# Patient Record
Sex: Male | Born: 1965
Health system: Southern US, Community
[De-identification: ages and names within clinical notes are randomized; demographics above are authoritative.]

## PROBLEM LIST (undated history)

## (undated) DIAGNOSIS — K589 Irritable bowel syndrome without diarrhea: Secondary | ICD-10-CM

## (undated) DIAGNOSIS — C801 Malignant (primary) neoplasm, unspecified: Secondary | ICD-10-CM

## (undated) DIAGNOSIS — K219 Gastro-esophageal reflux disease without esophagitis: Secondary | ICD-10-CM

## (undated) DIAGNOSIS — Z87442 Personal history of urinary calculi: Secondary | ICD-10-CM

## (undated) DIAGNOSIS — T7840XA Allergy, unspecified, initial encounter: Secondary | ICD-10-CM

## (undated) HISTORY — DX: Malignant (primary) neoplasm, unspecified: C80.1

## (undated) HISTORY — PX: COLONOSCOPY: SHX174

## (undated) HISTORY — PX: OTHER SURGICAL HISTORY: SHX169

## (undated) HISTORY — PX: EXCISIONAL HEMORRHOIDECTOMY: SHX1541

## (undated) HISTORY — DX: Allergy, unspecified, initial encounter: T78.40XA

---

## 2004-09-27 ENCOUNTER — Ambulatory Visit: Payer: Self-pay | Admitting: Family Medicine

## 2005-07-04 ENCOUNTER — Ambulatory Visit: Payer: Self-pay | Admitting: Family Medicine

## 2006-06-23 ENCOUNTER — Ambulatory Visit: Payer: Self-pay | Admitting: Family Medicine

## 2007-01-04 ENCOUNTER — Ambulatory Visit: Payer: Self-pay | Admitting: Family Medicine

## 2007-01-04 DIAGNOSIS — M545 Low back pain, unspecified: Secondary | ICD-10-CM | POA: Insufficient documentation

## 2008-08-14 ENCOUNTER — Ambulatory Visit: Payer: Self-pay | Admitting: Internal Medicine

## 2008-08-24 ENCOUNTER — Ambulatory Visit: Payer: Self-pay | Admitting: Internal Medicine

## 2008-08-24 LAB — CONVERTED CEMR LAB
ALT: 24 units/L (ref 0–53)
AST: 20 units/L (ref 0–37)
Albumin: 4 g/dL (ref 3.5–5.2)
Alkaline Phosphatase: 60 units/L (ref 39–117)
Basophils Relative: 0.3 % (ref 0.0–3.0)
Calcium: 9.1 mg/dL (ref 8.4–10.5)
Chloride: 106 meq/L (ref 96–112)
GFR calc non Af Amer: 77.9 mL/min (ref >60)
HCT: 45.9 % (ref 39.0–52.0)
Monocytes Absolute: 1.2 10*3/uL — ABNORMAL HIGH (ref 0.1–1.0)
Monocytes Relative: 14.7 % — ABNORMAL HIGH (ref 3.0–12.0)
Neutro Abs: 4.3 10*3/uL (ref 1.4–7.7)
Neutrophils Relative %: 50.2 % (ref 43.0–77.0)
Platelets: 276 10*3/uL (ref 150.0–400.0)
Potassium: 4.3 meq/L (ref 3.5–5.1)
RBC: 5.14 M/uL (ref 4.22–5.81)
Sodium: 140 meq/L (ref 135–145)
WBC: 8.4 10*3/uL (ref 4.5–10.5)

## 2008-09-28 ENCOUNTER — Ambulatory Visit: Payer: Self-pay | Admitting: Internal Medicine

## 2009-11-14 ENCOUNTER — Ambulatory Visit (HOSPITAL_COMMUNITY): Admission: RE | Admit: 2009-11-14 | Discharge: 2009-11-14 | Payer: Self-pay | Admitting: Specialist

## 2009-12-04 ENCOUNTER — Encounter (INDEPENDENT_AMBULATORY_CARE_PROVIDER_SITE_OTHER): Payer: Self-pay | Admitting: *Deleted

## 2010-05-28 NOTE — Letter (Signed)
Summary: Nadara Eaton letter  [facility]  [address] [place], [place] [address]   Phone: [phone]  Fax: [phone]       12/04/2009 MRN: [id number]  LEBERT LOVERN [address] [place], [place]  [address]  Dear Mr. Ragon,  [facility] [place] and [place] [facility] announce the retirement of Arta Silence, M.D., from full-time practice at the [facility] office effective October 25, 2009 and his plans of returning part-time.  It is important to Dr. Hetty Ely and to our practice that you understand that [facility] has seven physicians in our office for your health care needs.  We will continue to offer the same exceptional care that you have today.    Dr. Hetty Ely has spoken to many of you about his plans for retirement and returning part-time in the fall.   We will continue to work with you through the transition to schedule appointments for you in the office and meet the high standards that [facility] is committed to.   Again, it is with great pleasure that we share the news that Dr. Hetty Ely will return to [facility] in October of 2011 with a reduced schedule.    If you have any questions, or would like to request an appointment with one of our physicians, please call us at [phone] and press the option for Scheduling an appointment.  We take pleasure in providing you with excellent patient care and look forward to seeing you at your next office visit.  Our [facility] Physicians are:  Tillman Abide, M.D. Laurita Quint, M.D. Roxy Manns, M.D. Kerby Nora, M.D. Hannah Beat, M.D. Ruthe Mannan, M.D. We proudly welcomed Raechel Ache, M.D. and Eustaquio Boyden, M.D. to the practice in July/August 2011.  Sincerely,  [facility]

## 2010-07-16 ENCOUNTER — Ambulatory Visit (INDEPENDENT_AMBULATORY_CARE_PROVIDER_SITE_OTHER): Payer: 59 | Admitting: Internal Medicine

## 2010-07-16 ENCOUNTER — Encounter: Payer: Self-pay | Admitting: Internal Medicine

## 2010-07-16 DIAGNOSIS — S339XXA Sprain of unspecified parts of lumbar spine and pelvis, initial encounter: Secondary | ICD-10-CM | POA: Insufficient documentation

## 2010-07-16 DIAGNOSIS — M549 Dorsalgia, unspecified: Secondary | ICD-10-CM

## 2010-07-16 DIAGNOSIS — S335XXA Sprain of ligaments of lumbar spine, initial encounter: Secondary | ICD-10-CM

## 2010-07-25 NOTE — Letter (Signed)
Summary: history form   history form   Imported By: Eugenio Hoes 07/16/2010 15:09:49  _____________________________________________________________________  External Attachment:    Type:   Image     Comment:   External Document

## 2010-07-25 NOTE — Assessment & Plan Note (Signed)
Summary: LOWER BACK PAIN    Prior Medication List:  DICYCLOMINE HCL 20 MG  TABS (DICYCLOMINE HCL) take one tablet by mouth three times a day before each meal PRILOSEC OTC 20 MG  TBEC (OMEPRAZOLE MAGNESIUM) 1 tablet by mouth once daily in the morning   Current Allergies: No known allergies History of Present Illness Chief Complaint: low back pain x 4 days History of Present Illness: Low back pain episodes several times since injured in [place] yrs ago. Usually gets well in a few days and no studies ever done. Used to do alot of stretching but became less diligent. On 3/16 was transferring cases of sodas and felt twinge in low back which became slowly worse. Pain was severe yesterday, medium today and is dull, bilateral lumbar and sij area. No radiation, weakness or numbness in legs. 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 Has been on ibup  600 four times daily.  REVIEW OF SYSTEMS Constitutional Symptoms      Denies fever, chills, night sweats, weight loss, weight gain, and fatigue.  Eyes       Denies change in vision, eye pain, eye discharge, glasses, contact lenses, and eye surgery. Ear/Nose/Throat/Mouth       Denies hearing loss/aids, change in hearing, ear pain, ear discharge, dizziness, frequent runny nose, frequent nose bleeds, sinus problems, sore throat, hoarseness, and tooth pain or bleeding.  Respiratory       Denies dry cough, productive cough, wheezing, shortness of breath, asthma, bronchitis, and emphysema/COPD.  Cardiovascular       Denies murmurs, chest pain, and tires easily with exhertion.    Gastrointestinal       Denies stomach pain, nausea/vomiting, diarrhea, constipation, blood in bowel movements, and indigestion. Genitourniary       Denies painful urination, kidney stones, and loss of urinary control. Neurological       Denies paralysis, seizures, and fainting/blackouts. Musculoskeletal       Complains of decreased range of motion.      Denies muscle pain,  joint pain, joint stiffness, redness, swelling, muscle weakness, and gout.  Skin       Denies bruising, unusual mles/lumps or sores, and hair/skin or nail changes.  Psych       Denies mood changes, temper/anger issues, anxiety/stress, speech problems, depression, and sleep problems.  Past History:  Past Medical History: Last updated: 08/14/2008 Diverticulosis  Past Surgical History: Last updated: 08/14/2008 Hemorrhoidectomy  Family History: Last updated: 08/14/2008 No FH of Colon Cancer:  Social History: Last updated: 07/16/2010 Occupation: Patent examiner, [employer]; Archivist, [employer] Patient has never smoked.  Alcohol Use - yes Daily Caffeine Use Illicit Drug Use - no Married, no children  Social History: Occupation: Patent examiner, [employer]; Archivist, [employer] Patient has never smoked.  Alcohol Use - yes Daily Caffeine Use Illicit Drug Use - no Married, no children Physical Exam General appearance: well developed, well nourished, no acute distress Head: normocephalic, atraumatic Neck: neck supple,  trachea midline, no masses Extremities: normal extremities Neurological: grossly intact and non-focal. Back: tender musculature right lower back, straight leg raises negative bilaterally, deep tendon reflexes 2+ at achilles and patella Skin: no obvious rashes MSE: oriented to time, place, and person Assessment New Problems: SPRAIN AND STRAIN OF LUMBOSACRAL (ICD-846.0)   Plan New Medications/Changes: METHOCARBAMOL 750 MG TABS (METHOCARBAMOL) 1-2 by mouth four times daily as needed back spasm  #40 x 1, 07/16/2010, J. Juline Patch MD  The patient and/or caregiver has been counseled thoroughly with regard to medications prescribed including dosage, schedule, interactions, rationale for use, and possible side effects and they verbalize understanding.  Diagnoses and expected course of  recovery discussed and will return if not improved as expected or if the condition worsens. Patient and/or caregiver verbalized understanding.  Prescriptions: METHOCARBAMOL 750 MG TABS (METHOCARBAMOL) 1-2 by mouth four times daily as needed back spasm  #40 x 1   Entered and Authorized by:   J. Juline Patch MD   Signed by:   Shela Commons. Juline Patch MD on 07/16/2010   Method used:   Electronically to        [facility]  [address] (retail)       [address]       [place], [place]  [address]       Ph: [phone] or [phone]       Fax: [phone]   RxID:   [id number]   Patient Instructions: 1)  gentle stretches as per previous. 2)  alternating ice and heat to lumbar. 3)  proper technique lifting and wt transfer. 4)  continue ibuprofen 4 times daily with food. 5)  Take 650-1000mg  of Tylenol every 4-6 hours as needed for relief of pain or comfort of fever AVOID taking more than 4000mg   in a 24 hour period (can cause liver damage in higher doses). 6)  Recommended remaining out of work for 5 days

## 2013-03-09 ENCOUNTER — Other Ambulatory Visit: Payer: Self-pay | Admitting: Family Medicine

## 2013-03-09 ENCOUNTER — Ambulatory Visit
Admission: RE | Admit: 2013-03-09 | Discharge: 2013-03-09 | Disposition: A | Discharge status: Home / Self Care | Payer: 59 | Source: Ambulatory Visit | Attending: Family Medicine | Admitting: Family Medicine

## 2013-03-09 DIAGNOSIS — M545 Low back pain, unspecified: Secondary | ICD-10-CM

## 2014-05-26 ENCOUNTER — Ambulatory Visit (INDEPENDENT_AMBULATORY_CARE_PROVIDER_SITE_OTHER): Payer: Self-pay | Admitting: Surgery

## 2014-05-26 NOTE — H&P (Signed)
History of Present Illness Martin Mcclure. Martin Waltman MD; 05/26/2014 3:56 PM) Patient words: lipomas left arm, left thigh, and back.  The patient is a 49 year old male who presents with a complaint of Mass. Referred by Lennie Odor PA-C for evaluation of multiple lipomas This is a healthy 49 yo male who presents with four separate slowly enlarging masses (L thigh, L forearm, R abdominal wall, R side of back). Some of these have begun to become tender to pressure. None of these have become infected. He presents now for evaluation for excision. Other Problems Marjean Donna, CMA; 05/26/2014 10:04 AM) Back Pain Hemorrhoids Melanoma  Past Surgical History Marjean Donna, CMA; 05/26/2014 10:04 AM) Hemorrhoidectomy  Diagnostic Studies History Marjean Donna, CMA; 05/26/2014 10:04 AM) Colonoscopy 1-5 years ago  Allergies Davy Pique Bynum, CMA; 05/26/2014 10:05 AM) No Known Drug Allergies 05/26/2014  Medication History (Sonya Bynum, CMA; 05/26/2014 10:05 AM) No Current Medications  Social History Marjean Donna, CMA; 05/26/2014 10:04 AM) Alcohol use Occasional alcohol use. Caffeine use Coffee. No drug use Tobacco use Never smoker.  Family History Marjean Donna, [place]; 05/26/2014 10:04 AM) Alcohol Abuse Father. Seizure disorder Sister.     Review of Systems Davy Pique Bynum CMA; 05/26/2014 10:04 AM) General Not Present- Appetite Loss, Chills, Fatigue, Fever, Night Sweats, Weight Gain and Weight Loss. Skin Not Present- Change in Wart/Mole, Dryness, Hives, Jaundice, New Lesions, Non-Healing Wounds, Rash and Ulcer. HEENT Not Present- Earache, Hearing Loss, Hoarseness, Nose Bleed, Oral Ulcers, Ringing in the Ears, Seasonal Allergies, Sinus Pain, Sore Throat, Visual Disturbances, Wears glasses/contact lenses and Yellow Eyes. Respiratory Not Present- Bloody sputum, Chronic Cough, Difficulty Breathing, Snoring and Wheezing. Breast Not Present- Breast Mass, Breast Pain, Nipple Discharge and Skin  Changes. Cardiovascular Not Present- Chest Pain, Difficulty Breathing Lying Down, Leg Cramps, Palpitations, Rapid Heart Rate, Shortness of Breath and Swelling of Extremities. Gastrointestinal Not Present- Abdominal Pain, Bloating, Bloody Stool, Change in Bowel Habits, Chronic diarrhea, Constipation, Difficulty Swallowing, Excessive gas, Gets full quickly at meals, Hemorrhoids, Indigestion, Nausea, Rectal Pain and Vomiting. Male Genitourinary Not Present- Blood in Urine, Change in Urinary Stream, Frequency, Impotence, Nocturia, Painful Urination, Urgency and Urine Leakage. Musculoskeletal Not Present- Back Pain, Joint Pain, Joint Stiffness, Muscle Pain, Muscle Weakness and Swelling of Extremities. Neurological Not Present- Decreased Memory, Fainting, Headaches, Numbness, Seizures, Tingling, Tremor, Trouble walking and Weakness. Psychiatric Not Present- Anxiety, Bipolar, Change in Sleep Pattern, Depression, Fearful and Frequent crying. Endocrine Not Present- Cold Intolerance, Excessive Hunger, Hair Changes, Heat Intolerance, Hot flashes and New Diabetes. Hematology Not Present- Easy Bruising, Excessive bleeding, Gland problems, HIV and Persistent Infections.  Vitals (Sonya Bynum CMA; 05/26/2014 10:05 AM) 05/26/2014 10:05 AM Weight: 228 lb Height: 70in Body Surface Area: 2.26 m Body Mass Index: 32.71 kg/m Temp.: 61F(Temporal)  Pulse: 72 (Regular)  BP: 128/72 (Sitting, Left Arm, Standard)     Physical Exam Rodman Key K. Baya Lentz MD; 05/26/2014 3:59 PM)  The physical exam findings are as follows: Note:WDWN in NAD L volar forearm - 2 cm subcutaneous mass, soft, well-demarcated L anterior thigh - 4 cm palpable subcutaneous mass, soft, well-demarcated R anterior abdominal wall below costal margin - 3 cm palpable subcutaneous mass, soft, well-demarcated R side of back medial to scapula - 3 cm palpable subcutaneous mass    Assessment & Plan Rodman Key K. Deonna Krummel MD; 05/26/2014 10:32  AM)  LIPOMA OF LEFT UPPER EXTREMITY (214.8  D17.22)  LIPOMA OF BACK (214.8  D17.1)  LIPOMA OF ABDOMINAL WALL (214.1  D17.1)  LIPOMA OF LEFT LOWER EXTREMITY (214.1  D17.24)  Current Plans Schedule for Surgery - Excision of subcutaneous lipomas - left forearm, left anterior thigh, right anterior abdominal wall, right back. The surgical procedure has been discussed with the patient. Potential risks, benefits, alternative treatments, and expected outcomes have been explained. All of the patient's questions at this time have been answered. The likelihood of reaching the patient's treatment goal is good. The patient understand the proposed surgical procedure and wishes to proceed.   Martin Mcclure. Georgette [place], MD, [facility] Surgery  General/ Trauma Surgery  05/26/2014 4:01 PM

## 2014-11-27 ENCOUNTER — Encounter: Payer: Self-pay | Admitting: Internal Medicine

## 2016-04-30 DIAGNOSIS — J069 Acute upper respiratory infection, unspecified: Secondary | ICD-10-CM | POA: Diagnosis not present

## 2016-06-24 DIAGNOSIS — Z125 Encounter for screening for malignant neoplasm of prostate: Secondary | ICD-10-CM | POA: Diagnosis not present

## 2016-06-24 DIAGNOSIS — Z0000 Encounter for general adult medical examination without abnormal findings: Secondary | ICD-10-CM | POA: Diagnosis not present

## 2016-07-18 ENCOUNTER — Ambulatory Visit: Payer: Self-pay | Admitting: Surgery

## 2016-07-18 DIAGNOSIS — D1724 Benign lipomatous neoplasm of skin and subcutaneous tissue of left leg: Secondary | ICD-10-CM | POA: Diagnosis not present

## 2016-07-18 DIAGNOSIS — D1722 Benign lipomatous neoplasm of skin and subcutaneous tissue of left arm: Secondary | ICD-10-CM | POA: Diagnosis not present

## 2016-07-18 DIAGNOSIS — D171 Benign lipomatous neoplasm of skin and subcutaneous tissue of trunk: Secondary | ICD-10-CM | POA: Diagnosis not present

## 2016-07-18 NOTE — H&P (Signed)
  History of Present Illness Martin Mcclure. Antia Rahal MD; 07/18/2016 12:31 PM) The patient is a 51 year old male who presents with a complaint of Mass. The patient is a 51 year old male who presents with a complaint of multiple Masses. Referred by Lennie Odor PA-C for evaluation of multiple lipomas This is a healthy 51 yo male who presents with five separate slowly enlarging masses (L thigh, L forearm, R abdominal wall, L abdominal wall R side of back). Some of these have begun to become tender to pressure. None of these have become infected. He presents now for evaluation for excision. He was evaluated in 2016, but decided not to have surgery at that time. These have enlarged, so he comes in today to discuss excision.   Problem List/Past Medical Rodman Key K. Izzabell Klasen, MD; 07/18/2016 12:32 PM) LIPOMA OF LEFT LOWER EXTREMITY (D17.24) LIPOMA OF LEFT UPPER EXTREMITY (T15.72)  Past Surgical History (Gianny Sabino K. Georgette [place], MD; 07/18/2016 12:32 PM) Hemorrhoidectomy  Diagnostic Studies History Rodman Key K. Starlin Steib, MD; 07/18/2016 12:32 PM) Colonoscopy 1-5 years ago  Allergies Rodman Key K. Huntley Knoop, MD; 07/18/2016 12:32 PM) No Known Drug Allergies 05/26/2014  Medication History Martin Mcclure. Sukhdeep Wieting, MD; 07/18/2016 12:32 PM) Fish Oil (1200MG  Capsule, Oral daily) Active. Medications Reconciled No Current Medications  Social History Martin Mcclure. Vence Lalor, MD; 07/18/2016 12:32 PM) Alcohol use Occasional alcohol use. Caffeine use Coffee. No drug use Tobacco use Never smoker.  Family History Martin Mcclure. Pinkney Venard, MD; 07/18/2016 12:32 PM) Alcohol Abuse Father. Seizure disorder Sister.  Other Problems Martin Mcclure. Kafi Dotter, MD; 07/18/2016 12:32 PM) Back Pain Hemorrhoids Melanoma    Vitals Malachy Moan RMA; 07/18/2016 9:39 AM) 07/18/2016 9:38 AM Weight: 220.2 lb Height: 70in Body Surface Area: 2.17 m Body Mass Index: 31.6 kg/m  Temp.: 98.55F  Pulse: 57 (Regular)  BP: 118/68 (Sitting, Left Arm,  Standard)      Physical Exam Rodman Key K. Manette Doto MD; 07/18/2016 12:32 PM)  The physical exam findings are as follows: Note:WDWN in NAD L volar forearm - 2 cm subcutaneous mass, soft, well-demarcated L anterior thigh - 3 cm palpable subcutaneous mass, soft, well-demarcated R anterior abdominal wall below costal margin - 2.5 cm palpable subcutaneous mass, soft, well-demarcated L anterior abdominal wall below costal margin - 1 cm palpable subcutaneous mass, soft, well-demarcated R side of back medial to scapula - 3 cm palpable subcutaneous mass    Assessment & Plan Rodman Key K. Jowanda Heeg MD; 07/18/2016 10:16 AM)  LIPOMA OF LEFT LOWER EXTREMITY (D17.24)  Current Plans Schedule for Surgery - Excision of subcutaneous lipomas - upper abdominal wall x 2, left forearm, left anterior thigh, upper back. The surgical procedure has been discussed with the patient. Potential risks, benefits, alternative treatments, and expected outcomes have been explained. All of the patient's questions at this time have been answered. The likelihood of reaching the patient's treatment goal is good. The patient understand the proposed surgical procedure and wishes to proceed. LIPOMA OF ABDOMINAL WALL (D17.1)  LIPOMA OF LEFT UPPER EXTREMITY (D17.22)  Martin Mcclure. Georgette [place], MD, [facility] Surgery  General/ Trauma Surgery  07/18/2016 12:33 PM

## 2016-10-09 DIAGNOSIS — D171 Benign lipomatous neoplasm of skin and subcutaneous tissue of trunk: Secondary | ICD-10-CM | POA: Diagnosis not present

## 2016-10-09 DIAGNOSIS — D1724 Benign lipomatous neoplasm of skin and subcutaneous tissue of left leg: Secondary | ICD-10-CM | POA: Diagnosis not present

## 2016-10-09 DIAGNOSIS — D1722 Benign lipomatous neoplasm of skin and subcutaneous tissue of left arm: Secondary | ICD-10-CM | POA: Diagnosis not present

## 2017-02-13 DIAGNOSIS — Z23 Encounter for immunization: Secondary | ICD-10-CM | POA: Diagnosis not present

## 2017-02-14 DIAGNOSIS — N481 Balanitis: Secondary | ICD-10-CM | POA: Diagnosis not present

## 2017-03-11 DIAGNOSIS — R21 Rash and other nonspecific skin eruption: Secondary | ICD-10-CM | POA: Diagnosis not present

## 2017-04-02 DIAGNOSIS — N481 Balanitis: Secondary | ICD-10-CM | POA: Diagnosis not present

## 2017-04-14 DIAGNOSIS — L821 Other seborrheic keratosis: Secondary | ICD-10-CM | POA: Diagnosis not present

## 2017-04-14 DIAGNOSIS — L570 Actinic keratosis: Secondary | ICD-10-CM | POA: Diagnosis not present

## 2017-04-14 DIAGNOSIS — L814 Other melanin hyperpigmentation: Secondary | ICD-10-CM | POA: Diagnosis not present

## 2017-04-14 DIAGNOSIS — D225 Melanocytic nevi of trunk: Secondary | ICD-10-CM | POA: Diagnosis not present

## 2017-04-30 DIAGNOSIS — R21 Rash and other nonspecific skin eruption: Secondary | ICD-10-CM | POA: Diagnosis not present

## 2017-05-04 DIAGNOSIS — L309 Dermatitis, unspecified: Secondary | ICD-10-CM | POA: Diagnosis not present

## 2017-05-07 DIAGNOSIS — H6121 Impacted cerumen, right ear: Secondary | ICD-10-CM | POA: Diagnosis not present

## 2017-05-07 DIAGNOSIS — H6061 Unspecified chronic otitis externa, right ear: Secondary | ICD-10-CM | POA: Diagnosis not present

## 2017-05-07 DIAGNOSIS — H6691 Otitis media, unspecified, right ear: Secondary | ICD-10-CM | POA: Diagnosis not present

## 2017-05-19 DIAGNOSIS — H6121 Impacted cerumen, right ear: Secondary | ICD-10-CM | POA: Diagnosis not present

## 2017-05-19 DIAGNOSIS — H6061 Unspecified chronic otitis externa, right ear: Secondary | ICD-10-CM | POA: Diagnosis not present

## 2017-06-09 DIAGNOSIS — H6062 Unspecified chronic otitis externa, left ear: Secondary | ICD-10-CM | POA: Diagnosis not present

## 2017-06-09 DIAGNOSIS — H6502 Acute serous otitis media, left ear: Secondary | ICD-10-CM | POA: Diagnosis not present

## 2017-06-16 DIAGNOSIS — L309 Dermatitis, unspecified: Secondary | ICD-10-CM | POA: Diagnosis not present

## 2017-06-25 ENCOUNTER — Encounter: Payer: Self-pay | Admitting: Internal Medicine

## 2017-06-25 DIAGNOSIS — Z125 Encounter for screening for malignant neoplasm of prostate: Secondary | ICD-10-CM | POA: Diagnosis not present

## 2017-06-25 DIAGNOSIS — Z0000 Encounter for general adult medical examination without abnormal findings: Secondary | ICD-10-CM | POA: Diagnosis not present

## 2017-06-25 DIAGNOSIS — Z13220 Encounter for screening for lipoid disorders: Secondary | ICD-10-CM | POA: Diagnosis not present

## 2017-09-15 ENCOUNTER — Telehealth (HOSPITAL_BASED_OUTPATIENT_CLINIC_OR_DEPARTMENT_OTHER): Payer: Self-pay | Admitting: *Deleted

## 2017-09-15 ENCOUNTER — Encounter (HOSPITAL_BASED_OUTPATIENT_CLINIC_OR_DEPARTMENT_OTHER): Payer: Self-pay | Admitting: Emergency Medicine

## 2017-09-15 ENCOUNTER — Other Ambulatory Visit: Payer: Self-pay

## 2017-09-15 ENCOUNTER — Emergency Department (HOSPITAL_BASED_OUTPATIENT_CLINIC_OR_DEPARTMENT_OTHER)
Admission: EM | Admit: 2017-09-15 | Discharge: 2017-09-15 | Disposition: A | Discharge status: Home / Self Care | Payer: 59 | Attending: Emergency Medicine | Admitting: Emergency Medicine

## 2017-09-15 DIAGNOSIS — R197 Diarrhea, unspecified: Secondary | ICD-10-CM

## 2017-09-15 HISTORY — DX: Irritable bowel syndrome, unspecified: K58.9

## 2017-09-15 LAB — GASTROINTESTINAL PANEL BY PCR, STOOL (REPLACES STOOL CULTURE)
ASTROVIRUS: NOT DETECTED
Adenovirus F40/41: NOT DETECTED
CAMPYLOBACTER SPECIES: NOT DETECTED
Cryptosporidium: NOT DETECTED
Cyclospora cayetanensis: NOT DETECTED
ENTEROTOXIGENIC E COLI (ETEC): DETECTED — AB
Entamoeba histolytica: NOT DETECTED
Enteroaggregative E coli (EAEC): DETECTED — AB
Enteropathogenic E coli (EPEC): NOT DETECTED
Giardia lamblia: NOT DETECTED
NOROVIRUS GI/GII: NOT DETECTED
PLESIMONAS SHIGELLOIDES: NOT DETECTED
Rotavirus A: NOT DETECTED
SAPOVIRUS (I, II, IV, AND V): NOT DETECTED
SHIGA LIKE TOXIN PRODUCING E COLI (STEC): NOT DETECTED
Salmonella species: NOT DETECTED
Shigella/Enteroinvasive E coli (EIEC): NOT DETECTED
Vibrio cholerae: NOT DETECTED
Vibrio species: NOT DETECTED
Yersinia enterocolitica: NOT DETECTED

## 2017-09-15 LAB — COMPREHENSIVE METABOLIC PANEL
ALT: 22 U/L (ref 17–63)
ANION GAP: 10 (ref 5–15)
AST: 20 U/L (ref 15–41)
Albumin: 4 g/dL (ref 3.5–5.0)
Alkaline Phosphatase: 48 U/L (ref 38–126)
BUN: 12 mg/dL (ref 6–20)
CHLORIDE: 107 mmol/L (ref 101–111)
CO2: 22 mmol/L (ref 22–32)
Calcium: 8.8 mg/dL — ABNORMAL LOW (ref 8.9–10.3)
Creatinine, Ser: 1 mg/dL (ref 0.61–1.24)
GFR calc non Af Amer: >60 mL/min (ref >60)
Glucose, Bld: 99 mg/dL (ref 65–99)
POTASSIUM: 3.8 mmol/L (ref 3.5–5.1)
Sodium: 139 mmol/L (ref 135–145)
Total Bilirubin: 0.6 mg/dL (ref 0.3–1.2)
Total Protein: 7 g/dL (ref 6.5–8.1)

## 2017-09-15 LAB — C DIFFICILE QUICK SCREEN W PCR REFLEX
C DIFFICILE (CDIFF) INTERP: NOT DETECTED
C DIFFICLE (CDIFF) ANTIGEN: NEGATIVE
C Diff toxin: NEGATIVE

## 2017-09-15 MED ORDER — CIPROFLOXACIN HCL 500 MG PO TABS: 500.0000 mg | ORAL_TABLET | Freq: Two times a day (BID) | ORAL | 0 refills | Status: DC

## 2017-09-15 MED FILL — CIPROFLOXACIN HCL 500 MG TA: 500 | 5 days supply | Qty: 10 | Fill #0

## 2017-09-15 NOTE — Discharge Instructions (Addendum)
Take Imodium as directed for diarrhea. Avoid milk or foods containing milk such as cheese or ice cream while having diarrhea.Make sure that you drink at least six 8 ounce glasses of water or Gatorade each day in order to stay well-hydrated. See your primary care physician if diarrhea is not slowing or if not feeling better in 2-3 days. Return if you develop lightheadedness, fainting or if your condition worsens for any reason.

## 2017-09-15 NOTE — ED Triage Notes (Signed)
Watery diarrhea since Thursday. Denies vomiting, fever. 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

## 2017-09-15 NOTE — ED Provider Notes (Addendum)
[facility] EMERGENCY DEPARTMENT Provider Note   CSN: [id number] Arrival date & time: 09/15/17  8341     History   Chief Complaint Chief Complaint  Patient presents with  . Diarrhea    HPI Martin Mcclure is a 52 y.o. male.patient with diarrhea onset 5 days ago.Diarrhea is nonbloody. He denies abdominal pain denies nausea or vomiting he feels otherwise well. He's had 4-6 episodes of diarrhea per day. He returned from [place] one week ago. He denies lightheadedness denies feverdenies nausea or vomiting. Feels otherwise well.nothing makes symptoms better or worse.No other associated symptoms. 2 days ago he try treating himself with Pepto-Bismol which turned his stools black . After stopping Pepto-Bismol, stools return to brown and watery. No recent antibiotic use.  HPI  Past Medical History:  Diagnosis Date  . IBS (irritable bowel syndrome)     Patient Active Problem List   Diagnosis Date Noted  . SPRAIN AND STRAIN OF LUMBOSACRAL 07/16/2010  . BACK PAIN, LUMBAR 01/04/2007    History reviewed. No pertinent surgical history.      Home Medications    Prior to Admission medications   Not on File    Family History No family history on file.  Social History Social History   Tobacco Use  . Smoking status: Never Smoker  . Smokeless tobacco: Never Used  Substance Use Topics  . Alcohol use: Yes  . Drug use: Not on file  former cigar smoker no drug use drinks 4 beers per week   Allergies   Patient has no known allergies.   Review of Systems Review of Systems  Constitutional: Negative.   HENT: Negative.   Respiratory: Negative.   Cardiovascular: Negative.   Gastrointestinal: Positive for diarrhea.  Musculoskeletal: Negative.   Skin: Negative.   Neurological: Negative.   Psychiatric/Behavioral: Negative.   All other systems reviewed and are negative.    Physical Exam Updated Vital Signs BP 122/76 (BP Location: Right Arm)   Pulse  61   Temp 98.2 F (36.8 C) (Oral)   Resp 18   Ht 5\' 10"  (1.778 m)   Wt 91.2 kg (201 lb)   SpO2 98%   BMI 28.84 kg/m   Physical Exam  Constitutional: He appears well-developed and well-nourished.  HENT:  Head: Normocephalic and atraumatic.  Eyes: Pupils are equal, round, and reactive to light. Conjunctivae are normal.  Neck: Neck supple. No tracheal deviation present. No thyromegaly present.  Cardiovascular: Normal rate and regular rhythm.  No murmur heard. Pulmonary/Chest: Effort normal and breath sounds normal.  Abdominal: Soft. Bowel sounds are normal. He exhibits no distension. There is tenderness.  Mild diffuse tenderness  Musculoskeletal: Normal range of motion. He exhibits no edema or tenderness.  Neurological: He is alert. Coordination normal.  Skin: Skin is warm and dry. No rash noted.  Psychiatric: He has a normal mood and affect.  Nursing note and vitals reviewed.    ED Treatments / Results  Labs (all labs ordered are listed, but only abnormal results are displayed) Labs Reviewed  GASTROINTESTINAL PANEL BY PCR, STOOL (REPLACES STOOL CULTURE)  C DIFFICILE QUICK SCREEN W PCR REFLEX  COMPREHENSIVE METABOLIC PANEL    EKG None  Radiology No results found.  Procedures Procedures (including critical care time)  Medications Ordered in ED Medications - No data to display  Results for orders placed or performed during the hospital encounter of 09/15/17  Comprehensive metabolic panel  Result Value Ref Range   Sodium 139 135 - 145 mmol/L  Potassium 3.8 3.5 - 5.1 mmol/L   Chloride 107 101 - 111 mmol/L   CO2 22 22 - 32 mmol/L   Glucose, Bld 99 65 - 99 mg/dL   BUN 12 6 - 20 mg/dL   Creatinine, Ser 1.00 0.61 - 1.24 mg/dL   Calcium 8.8 (L) 8.9 - 10.3 mg/dL   Total Protein 7.0 6.5 - 8.1 g/dL   Albumin 4.0 3.5 - 5.0 g/dL   AST 20 15 - 41 U/L   ALT 22 17 - 63 U/L   Alkaline Phosphatase 48 38 - 126 U/L   Total Bilirubin 0.6 0.3 - 1.2 mg/dL   GFR calc non Af  Amer >60 >60 mL/min   GFR calc Af Amer >60 >60 mL/min   Anion gap 10 5 - 15   No results found. Initial Impression / Assessment and Plan / ED Course  I have reviewed the triage vital signs and the nursing notes.  Pertinent labs & imaging results that were available during my care of the patient were reviewed by me and considered in my medical decision making (see chart for details).     8:05 AM patient looks well. We had lengthy discussion. We we'll treat empiricallywith Cipro  Imodium. Avoid dairy. Encourage oral hydration. Follow up with PMD if diarrhea not slowing 2-3 days. Stool panel pending  Final Clinical Impressions(s) / ED Diagnoses  Dx diarrhea Final diagnoses:  None    ED Discharge Orders    None       Orlie Dakin, MD 09/15/17 [id number]    Orlie Dakin, MD 09/15/17 [phone]

## 2017-09-22 ENCOUNTER — Encounter: Payer: Self-pay | Admitting: Internal Medicine

## 2017-09-24 DIAGNOSIS — M5410 Radiculopathy, site unspecified: Secondary | ICD-10-CM | POA: Diagnosis not present

## 2017-10-06 ENCOUNTER — Ambulatory Visit
Admission: RE | Admit: 2017-10-06 | Discharge: 2017-10-06 | Disposition: A | Discharge status: Home / Self Care | Payer: 59 | Source: Ambulatory Visit | Attending: Physician Assistant | Admitting: Physician Assistant

## 2017-10-06 ENCOUNTER — Encounter: Payer: Self-pay | Admitting: Internal Medicine

## 2017-10-06 ENCOUNTER — Other Ambulatory Visit: Payer: Self-pay | Admitting: Physician Assistant

## 2017-10-06 DIAGNOSIS — M7989 Other specified soft tissue disorders: Secondary | ICD-10-CM | POA: Diagnosis not present

## 2017-10-06 DIAGNOSIS — M25521 Pain in right elbow: Secondary | ICD-10-CM

## 2017-10-06 DIAGNOSIS — M25421 Effusion, right elbow: Secondary | ICD-10-CM

## 2017-10-07 DIAGNOSIS — L814 Other melanin hyperpigmentation: Secondary | ICD-10-CM | POA: Diagnosis not present

## 2017-10-07 DIAGNOSIS — D1801 Hemangioma of skin and subcutaneous tissue: Secondary | ICD-10-CM | POA: Diagnosis not present

## 2017-10-07 DIAGNOSIS — L821 Other seborrheic keratosis: Secondary | ICD-10-CM | POA: Diagnosis not present

## 2017-10-07 DIAGNOSIS — L570 Actinic keratosis: Secondary | ICD-10-CM | POA: Diagnosis not present

## 2017-11-02 DIAGNOSIS — M7021 Olecranon bursitis, right elbow: Secondary | ICD-10-CM | POA: Diagnosis not present

## 2017-12-02 ENCOUNTER — Encounter: Payer: Self-pay | Admitting: Internal Medicine

## 2017-12-04 ENCOUNTER — Ambulatory Visit (AMBULATORY_SURGERY_CENTER): Payer: Self-pay

## 2017-12-04 VITALS — Ht 71.0 in | Wt 211.6 lb

## 2017-12-04 DIAGNOSIS — Z1211 Encounter for screening for malignant neoplasm of colon: Secondary | ICD-10-CM

## 2017-12-04 NOTE — Progress Notes (Signed)
Denies allergies to eggs or soy products. Denies complication of anesthesia or sedation. Denies use of weight loss medication. Denies use of O2.   Emmi instructions declined.  

## 2017-12-07 ENCOUNTER — Encounter: Payer: Self-pay | Admitting: Internal Medicine

## 2017-12-18 ENCOUNTER — Encounter: Payer: Self-pay | Admitting: Internal Medicine

## 2017-12-18 ENCOUNTER — Ambulatory Visit (AMBULATORY_SURGERY_CENTER): Payer: 59 | Admitting: Internal Medicine

## 2017-12-18 VITALS — BP 101/53 | HR 56 | Temp 98.6°F | Resp 12 | Ht 71.0 in | Wt 211.0 lb

## 2017-12-18 DIAGNOSIS — Z1211 Encounter for screening for malignant neoplasm of colon: Secondary | ICD-10-CM | POA: Diagnosis not present

## 2017-12-18 MED ORDER — SODIUM CHLORIDE 0.9 % IV SOLN: 500.0000 mL | Freq: Once | INTRAVENOUS | Status: AC

## 2017-12-18 MED ORDER — SODIUM CHLORIDE 0.9 % IV SOLN: 500.0000 mL | Freq: Once | INTRAVENOUS | Status: DC

## 2017-12-18 NOTE — Patient Instructions (Signed)
Impression/Recommendations:  Resume previous diet. Continue present medications.  Repeat colonoscopy in 10 years for screening purposes.  YOU HAD AN ENDOSCOPIC PROCEDURE TODAY AT [facility]:   Refer to the procedure report that was given to you for any specific questions about what was found during the examination.  If the procedure report does not answer your questions, please call your gastroenterologist to clarify.  If you requested that your care partner not be given the details of your procedure findings, then the procedure report has been included in a sealed envelope for you to review at your convenience later.  YOU SHOULD EXPECT: Some feelings of bloating in the abdomen. Passage of more gas than usual.  Walking can help get rid of the air that was put into your GI tract during the procedure and reduce the bloating. If you had a lower endoscopy (such as a colonoscopy or flexible sigmoidoscopy) you may notice spotting of blood in your stool or on the toilet paper. If you underwent a bowel prep for your procedure, you may not have a normal bowel movement for a few days.  Please Note:  You might notice some irritation and congestion in your nose or some drainage.  This is from the oxygen used during your procedure.  There is no need for concern and it should clear up in a day or so.  SYMPTOMS TO REPORT IMMEDIATELY:   Following lower endoscopy (colonoscopy or flexible sigmoidoscopy):  Excessive amounts of blood in the stool  Significant tenderness or worsening of abdominal pains  Swelling of the abdomen that is new, acute  Fever of 100F or higher  For urgent or emergent issues, a gastroenterologist can be reached at any hour by calling [phone].   DIET:  We do recommend a small meal at first, but then you may proceed to your regular diet.  Drink plenty of fluids but you should avoid alcoholic beverages for 24 hours.  ACTIVITY:  You should plan to take it easy  for the rest of today and you should NOT DRIVE or use heavy machinery until tomorrow (because of the sedation medicines used during the test).    FOLLOW UP: Our staff will call the number listed on your records the next business day following your procedure to check on you and address any questions or concerns that you may have regarding the information given to you following your procedure. If we do not reach you, we will leave a message.  However, if you are feeling well and you are not experiencing any problems, there is no need to return our call.  We will assume that you have returned to your regular daily activities without incident.  If any biopsies were taken you will be contacted by phone or by letter within the next 1-3 weeks.  Please call us at [phone] if you have not heard about the biopsies in 3 weeks.    SIGNATURES/CONFIDENTIALITY: You and/or your care partner have signed paperwork which will be entered into your electronic medical record.  These signatures attest to the fact that that the information above on your After Visit Summary has been reviewed and is understood.  Full responsibility of the confidentiality of this discharge information lies with you and/or your care-partner.

## 2017-12-18 NOTE — Addendum Note (Signed)
Addended by: Dossie Arbour on: 12/18/2017 04:33 PM   Modules accepted: Orders

## 2017-12-18 NOTE — Progress Notes (Signed)
A/ox3 pleased with MAC, report to RN 

## 2017-12-18 NOTE — Op Note (Signed)
[facility] Patient Name: Martin Mcclure Procedure Date: 12/18/2017 1:55 PM MRN: [id number] Endoscopist: Gatha Mayer , MD Age: 52 Referring MD:  Date of Birth: 09-Mar-1966 Gender: Male Account #: [id number] Procedure:                Colonoscopy Indications:              Screening for colorectal malignant neoplasm Medicines:                Propofol per Anesthesia, Monitored Anesthesia Care Procedure:                Pre-Anesthesia Assessment:                           - Prior to the procedure, a History and Physical                            was performed, and patient medications and                            allergies were reviewed. The patient's tolerance of                            previous anesthesia was also reviewed. The risks                            and benefits of the procedure and the sedation                            options and risks were discussed with the patient.                            All questions were answered, and informed consent                            was obtained. Prior Anticoagulants: The patient has                            taken no previous anticoagulant or antiplatelet                            agents. ASA Grade Assessment: II - A patient with                            mild systemic disease. After reviewing the risks                            and benefits, the patient was deemed in                            satisfactory condition to undergo the procedure.                           After obtaining informed consent, the colonoscope  was passed under direct vision. Throughout the                            procedure, the patient's blood pressure, pulse, and                            oxygen saturations were monitored continuously. The                            Colonoscope was introduced through the anus and                            advanced to the the cecum, identified by   appendiceal orifice and ileocecal valve. The                            colonoscopy was performed without difficulty. The                            patient tolerated the procedure well. The quality                            of the bowel preparation was excellent. The                            ileocecal valve, appendiceal orifice, and rectum                            were photographed. The bowel preparation used was                            Miralax. Scope In: 2:06:19 PM Scope Out: 2:19:15 PM Scope Withdrawal Time: 0 hours 11 minutes 39 seconds  Total Procedure Duration: 0 hours 12 minutes 56 seconds  Findings:                 The entire examined colon appeared normal on direct                            and retroflexion views. Complications:            No immediate complications. Estimated Blood Loss:     Estimated blood loss: none. Impression:               - The entire examined colon is normal on direct and                            retroflexion views.                           - No specimens collected. Recommendation:           - Patient has a contact number available for                            emergencies. The signs and symptoms of potential  delayed complications were discussed with the                            patient. Return to normal activities tomorrow.                            Written discharge instructions were provided to the                            patient.                           - Resume previous diet.                           - Continue present medications.                           - Repeat colonoscopy in 10 years for screening                            purposes. Gatha Mayer, MD 12/18/2017 2:29:54 PM This report has been signed electronically.

## 2017-12-21 ENCOUNTER — Telehealth: Payer: Self-pay

## 2017-12-21 NOTE — Telephone Encounter (Signed)
  Follow up Call-  Call back number 12/18/2017  Post procedure Call Back phone  # [phone]  Permission to leave phone message Yes  Some recent data might be hidden     Patient questions:  Do you have a fever, pain , or abdominal swelling? No. Pain Score  0 *  Have you tolerated food without any problems? Yes.    Have you been able to return to your normal activities? Yes.    Do you have any questions about your discharge instructions: Diet   No. Medications  No. Follow up visit  No.   Do you have questions or concerns about your Care? No.  Actions: * If pain score is 4 or above: No action needed, pain <4.

## 2018-03-01 DIAGNOSIS — M25521 Pain in right elbow: Secondary | ICD-10-CM | POA: Diagnosis not present

## 2018-03-11 DIAGNOSIS — M7021 Olecranon bursitis, right elbow: Secondary | ICD-10-CM | POA: Diagnosis not present

## 2018-03-18 DIAGNOSIS — M25521 Pain in right elbow: Secondary | ICD-10-CM | POA: Diagnosis not present

## 2018-03-18 DIAGNOSIS — K580 Irritable bowel syndrome with diarrhea: Secondary | ICD-10-CM | POA: Diagnosis not present

## 2018-03-18 DIAGNOSIS — M545 Low back pain: Secondary | ICD-10-CM | POA: Diagnosis not present

## 2018-03-22 DIAGNOSIS — K580 Irritable bowel syndrome with diarrhea: Secondary | ICD-10-CM | POA: Diagnosis not present

## 2018-03-22 DIAGNOSIS — Z713 Dietary counseling and surveillance: Secondary | ICD-10-CM | POA: Diagnosis not present

## 2018-03-22 DIAGNOSIS — G8929 Other chronic pain: Secondary | ICD-10-CM | POA: Diagnosis not present

## 2018-04-07 DIAGNOSIS — M545 Low back pain: Secondary | ICD-10-CM | POA: Diagnosis not present

## 2018-05-22 DIAGNOSIS — R0789 Other chest pain: Secondary | ICD-10-CM | POA: Diagnosis not present

## 2018-05-22 DIAGNOSIS — R002 Palpitations: Secondary | ICD-10-CM | POA: Diagnosis not present

## 2018-05-22 DIAGNOSIS — R001 Bradycardia, unspecified: Secondary | ICD-10-CM | POA: Diagnosis not present

## 2018-05-22 DIAGNOSIS — R079 Chest pain, unspecified: Secondary | ICD-10-CM | POA: Diagnosis not present

## 2018-05-22 DIAGNOSIS — Z566 Other physical and mental strain related to work: Secondary | ICD-10-CM | POA: Diagnosis not present

## 2018-05-22 DIAGNOSIS — R9431 Abnormal electrocardiogram [ECG] [EKG]: Secondary | ICD-10-CM | POA: Diagnosis not present

## 2018-05-23 DIAGNOSIS — R001 Bradycardia, unspecified: Secondary | ICD-10-CM | POA: Diagnosis not present

## 2018-05-23 DIAGNOSIS — R9431 Abnormal electrocardiogram [ECG] [EKG]: Secondary | ICD-10-CM | POA: Diagnosis not present

## 2018-06-08 DIAGNOSIS — R079 Chest pain, unspecified: Secondary | ICD-10-CM | POA: Diagnosis not present

## 2018-06-28 DIAGNOSIS — Z13220 Encounter for screening for lipoid disorders: Secondary | ICD-10-CM | POA: Diagnosis not present

## 2018-06-28 DIAGNOSIS — R002 Palpitations: Secondary | ICD-10-CM | POA: Diagnosis not present

## 2018-06-28 DIAGNOSIS — Z0000 Encounter for general adult medical examination without abnormal findings: Secondary | ICD-10-CM | POA: Diagnosis not present

## 2018-11-10 IMAGING — CR DG ELBOW COMPLETE 3+V*R*
4 series · 4 of 4 positions shown · non-contrast
Comparison: None.

CLINICAL DATA: Elbow swelling and pain, no injury.

EXAM:
RIGHT ELBOW - COMPLETE 3+ VIEW

[x elbow joint ap right]
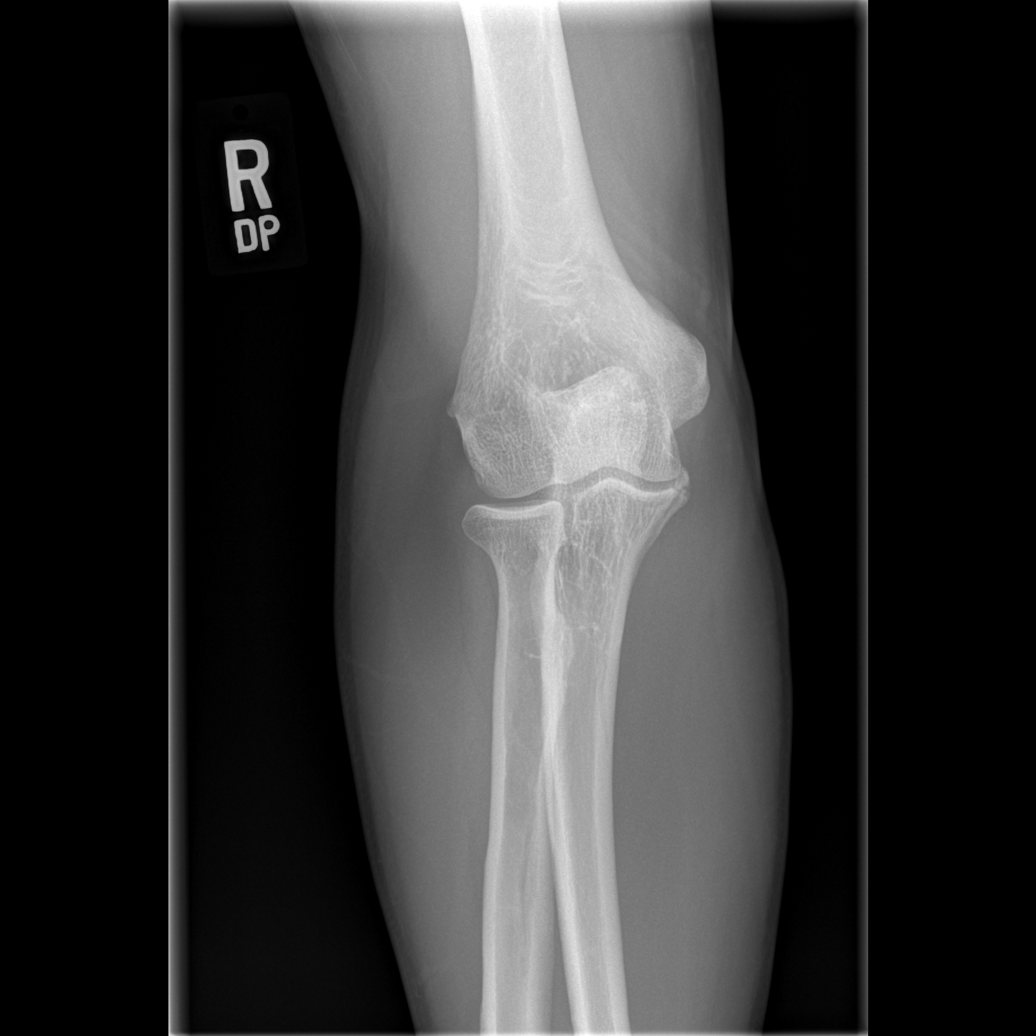

[x elbow joint obl. right (1 of 2)]
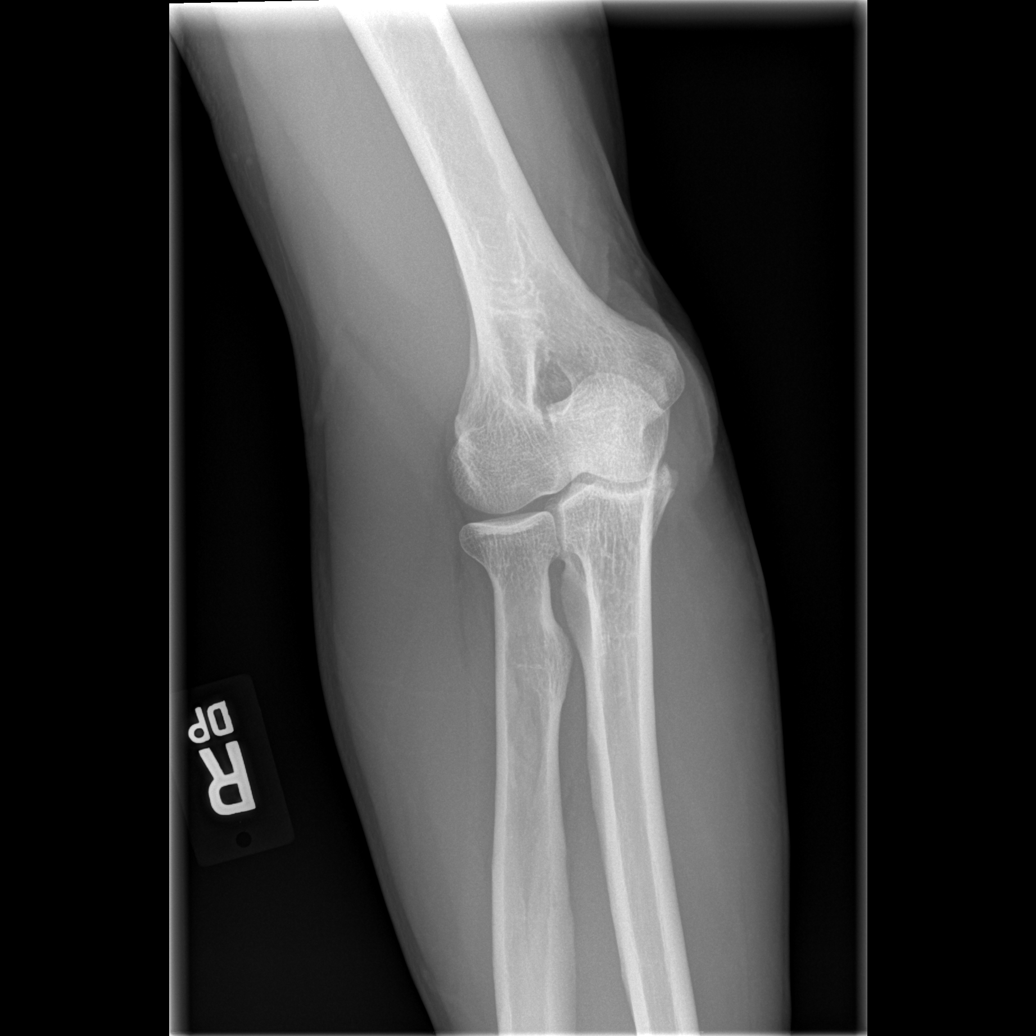

[x elbow joint obl. right (2 of 2)]
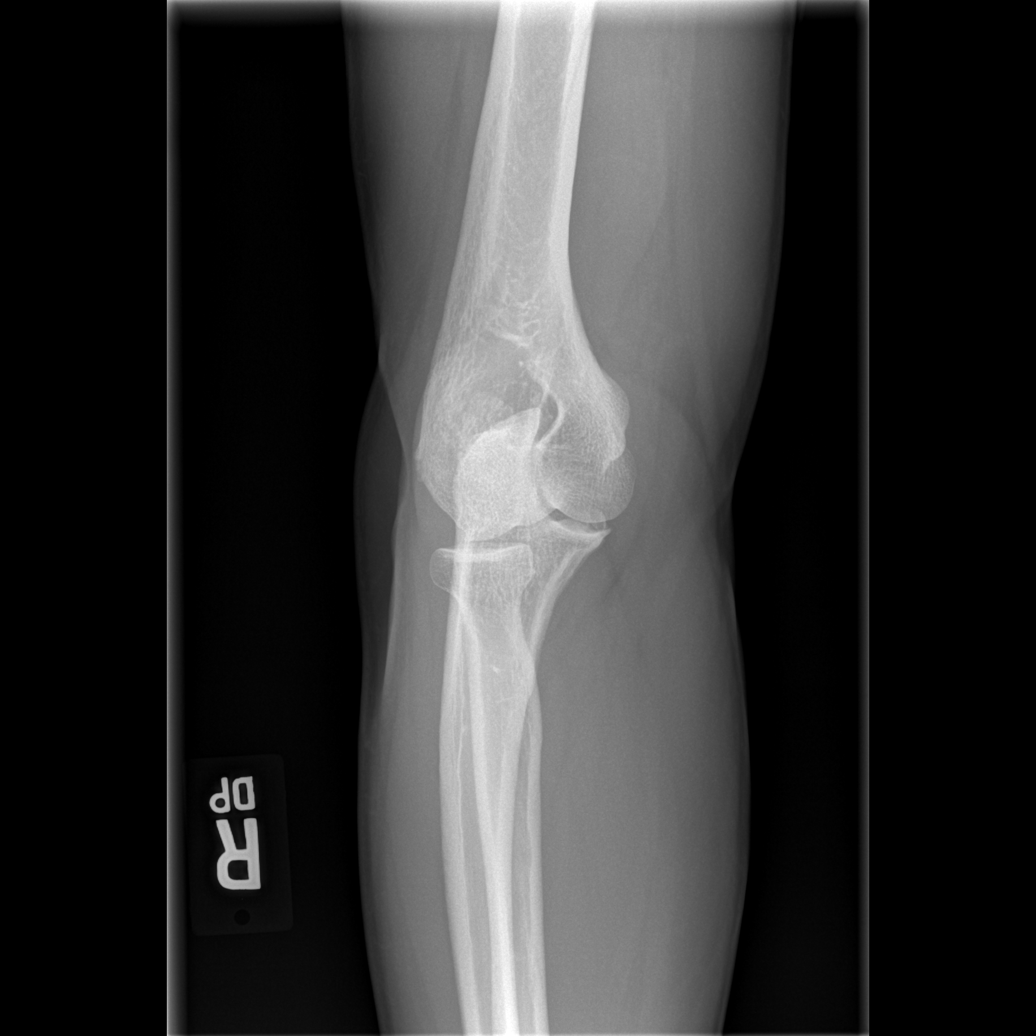

[x elbow joint lat right]
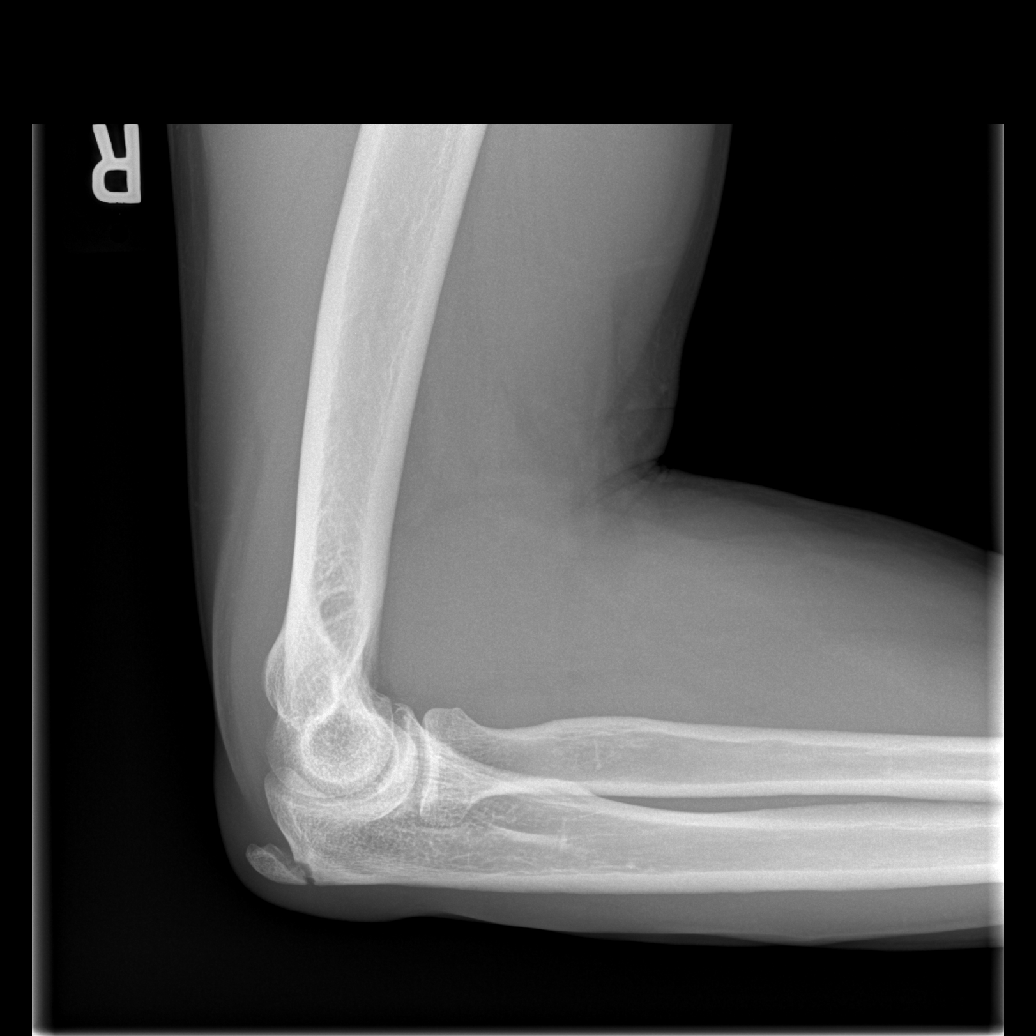

[4 of 4 positions shown; findings below may reference images not displayed]

FINDINGS: There is focal soft tissue swelling over the olecranon, where there
is a prominent enthesophyte. Mild degenerative changes in the elbow.
No joint effusion.
IMPRESSION: Focal soft tissue swelling over a prominent olecranon enthesophyte,
most indicative of olecranon bursitis.

## 2023-12-24 ENCOUNTER — Encounter: Admitting: Sports Medicine

## 2024-02-03 ENCOUNTER — Emergency Department (HOSPITAL_BASED_OUTPATIENT_CLINIC_OR_DEPARTMENT_OTHER)

## 2024-02-03 ENCOUNTER — Emergency Department (HOSPITAL_BASED_OUTPATIENT_CLINIC_OR_DEPARTMENT_OTHER)
Admission: EM | Admit: 2024-02-03 | Discharge: 2024-02-03 | Disposition: A | Discharge status: Home / Self Care | Attending: Emergency Medicine | Admitting: Emergency Medicine

## 2024-02-03 ENCOUNTER — Other Ambulatory Visit: Payer: Self-pay

## 2024-02-03 ENCOUNTER — Encounter (HOSPITAL_BASED_OUTPATIENT_CLINIC_OR_DEPARTMENT_OTHER): Payer: Self-pay | Admitting: Emergency Medicine

## 2024-02-03 DIAGNOSIS — Z87891 Personal history of nicotine dependence: Secondary | ICD-10-CM | POA: Insufficient documentation

## 2024-02-03 DIAGNOSIS — Z85828 Personal history of other malignant neoplasm of skin: Secondary | ICD-10-CM | POA: Insufficient documentation

## 2024-02-03 DIAGNOSIS — N50812 Left testicular pain: Secondary | ICD-10-CM | POA: Insufficient documentation

## 2024-02-03 DIAGNOSIS — R109 Unspecified abdominal pain: Secondary | ICD-10-CM | POA: Insufficient documentation

## 2024-02-03 DIAGNOSIS — R110 Nausea: Secondary | ICD-10-CM | POA: Insufficient documentation

## 2024-02-03 DIAGNOSIS — N201 Calculus of ureter: Secondary | ICD-10-CM

## 2024-02-03 LAB — URINALYSIS, MICROSCOPIC (REFLEX)
RBC / HPF: NONE SEEN RBC/hpf (ref 0–5)
WBC, UA: NONE SEEN WBC/hpf (ref 0–5)

## 2024-02-03 LAB — BASIC METABOLIC PANEL WITH GFR
Anion gap: 16 — ABNORMAL HIGH (ref 5–15)
BUN: 20 mg/dL (ref 6–20)
CO2: 20 mmol/L — ABNORMAL LOW (ref 22–32)
Calcium: 9.1 mg/dL (ref 8.9–10.3)
Chloride: 98 mmol/L (ref 98–111)
Creatinine, Ser: 1.74 mg/dL — ABNORMAL HIGH (ref 0.61–1.24)
GFR, Estimated: 45 mL/min — ABNORMAL LOW (ref >60)
Glucose, Bld: 151 mg/dL — ABNORMAL HIGH (ref 70–99)
Potassium: 3.8 mmol/L (ref 3.5–5.1)
Sodium: 134 mmol/L — ABNORMAL LOW (ref 135–145)

## 2024-02-03 LAB — URINALYSIS, ROUTINE W REFLEX MICROSCOPIC
Bilirubin Urine: NEGATIVE
Glucose, UA: NEGATIVE mg/dL
Hgb urine dipstick: NEGATIVE
Ketones, ur: 80 mg/dL — AB
Leukocytes,Ua: NEGATIVE
Nitrite: NEGATIVE
Protein, ur: 30 mg/dL — AB
Specific Gravity, Urine: >=1.03 (ref 1.005–1.030)
pH: 6 (ref 5.0–8.0)

## 2024-02-03 LAB — CBC WITH DIFFERENTIAL/PLATELET
Abs Immature Granulocytes: 0.09 K/uL — ABNORMAL HIGH (ref 0.00–0.07)
Basophils Absolute: 0.1 K/uL (ref 0.0–0.1)
Basophils Relative: 0 %
Eosinophils Absolute: 0 K/uL (ref 0.0–0.5)
Eosinophils Relative: 0 %
HCT: 42.3 % (ref 39.0–52.0)
Hemoglobin: 15.3 g/dL (ref 13.0–17.0)
Immature Granulocytes: 1 %
Lymphocytes Relative: 8 %
Lymphs Abs: 1.6 K/uL (ref 0.7–4.0)
MCH: 30.9 pg (ref 26.0–34.0)
MCHC: 36.2 g/dL — ABNORMAL HIGH (ref 30.0–36.0)
MCV: 85.5 fL (ref 80.0–100.0)
Monocytes Absolute: 1.5 K/uL — ABNORMAL HIGH (ref 0.1–1.0)
Monocytes Relative: 8 %
Neutro Abs: 16.7 K/uL — ABNORMAL HIGH (ref 1.7–7.7)
Neutrophils Relative %: 83 %
Platelets: 290 K/uL (ref 150–400)
RBC: 4.95 MIL/uL (ref 4.22–5.81)
RDW: 12.2 % (ref 11.5–15.5)
WBC: 20 K/uL — ABNORMAL HIGH (ref 4.0–10.5)
nRBC: 0 % (ref 0.0–0.2)

## 2024-02-03 MED ORDER — ONDANSETRON HCL 4 MG/2ML IJ SOLN
4.0000 mg | Freq: Once | INTRAMUSCULAR | Status: AC
  Administered 2024-02-03: 4 mg via INTRAVENOUS
  Filled 2024-02-03: qty 2

## 2024-02-03 MED ORDER — SODIUM CHLORIDE 0.9 % IV BOLUS
1000.0000 mL | Freq: Once | INTRAVENOUS | Status: AC
Start: 2024-02-03 — End: 2024-02-03
  Administered 2024-02-03: 1000 mL via INTRAVENOUS

## 2024-02-03 MED ORDER — HYDROMORPHONE HCL 1 MG/ML IJ SOLN
1.0000 mg | Freq: Once | INTRAMUSCULAR | Status: AC
  Administered 2024-02-03: 1 mg via INTRAVENOUS
  Filled 2024-02-03: qty 1

## 2024-02-03 MED ORDER — SODIUM CHLORIDE 0.9 % IV SOLN: INTRAVENOUS | Status: DC

## 2024-02-03 MED ORDER — ONDANSETRON 4 MG PO TBDP: 4.0000 mg | ORAL_TABLET | Freq: Three times a day (TID) | ORAL | 0 refills | Status: AC | PRN

## 2024-02-03 MED ORDER — HYDROCODONE-ACETAMINOPHEN 5-325 MG PO TABS: 1.0000 | ORAL_TABLET | Freq: Four times a day (QID) | ORAL | 0 refills | Status: DC | PRN

## 2024-02-03 MED ORDER — TAMSULOSIN HCL 0.4 MG PO CAPS: 0.4000 mg | ORAL_CAPSULE | Freq: Every day | ORAL | 0 refills | Status: AC

## 2024-02-03 NOTE — ED Notes (Signed)
 Patient transported to CT

## 2024-02-03 NOTE — ED Triage Notes (Signed)
 Pt reports LT testicle pain that started yesterday; it had improved somewhat today; pain radiates to lower pelvic area; sts testicle feels like it's going to pop

## 2024-02-03 NOTE — Discharge Instructions (Addendum)
 Take the Flomax as directed.  Take the pain medicine as directed.  Take the antinausea medicine as directed.  Make an appoint follow-up with urology, call them tomorrow.  CT shows a left-sided ureteral stone.  That explains your pain.  Will also recommend 800 mg of Motrin every 8 hours.  Return for any fever chills or worse pain.

## 2024-02-03 NOTE — ED Provider Notes (Addendum)
 [place] [facility] Provider Note   CSN: [id number] Arrival date & time: 02/03/24  8197     Patient presents with: Testicle Pain   Martin Mcclure is a 58 y.o. male.   Patient states she has had some pain in the left testicle since April.  Has been mild not significant seen by the [facility] for that.  Starting yesterday at about 8 in the morning that pain increased.  Got a little bit better around noon time then afternoon it started to increase today much worse.  Associated with some nausea but no vomiting.  Right testicle feels fine.  Patient is been able to pee twice today.  Past medical history significant for irritable bowel syndrome history of skin cancer to the nose.  Past surgical history significant for hemorrhoidectomy colonoscopy lipoma removal.  Patient is a former smoker quit in 2013.       Prior to Admission medications   Medication Sig Start Date End Date Taking? Authorizing Provider  Multiple Vitamin (MULTIVITAMIN) tablet Take 1 tablet by mouth daily.    [provider]    Allergies: Patient has no known allergies.    Review of Systems  Constitutional:  Negative for chills and fever.  HENT:  Negative for ear pain and sore throat.   Eyes:  Negative for pain and visual disturbance.  Respiratory:  Negative for cough and shortness of breath.   Cardiovascular:  Negative for chest pain and palpitations.  Gastrointestinal:  Positive for abdominal pain and nausea. Negative for vomiting.  Genitourinary:  Positive for testicular pain. Negative for dysuria and hematuria.  Musculoskeletal:  Negative for arthralgias and back pain.  Skin:  Negative for color change and rash.  Neurological:  Negative for seizures and syncope.  All other systems reviewed and are negative.   Updated Vital Signs BP (!) 147/100   Pulse 65   Temp 98.1 F (36.7 C) (Oral)   Resp (!) 28   Ht 1.778 m (5' 10)   Wt 99.8 kg   SpO2 100%   BMI 31.57  kg/m   Physical Exam Vitals and nursing note reviewed.  Constitutional:      General: He is in acute distress.     Appearance: Normal appearance. He is well-developed.  HENT:     Head: Normocephalic and atraumatic.     Mouth/Throat:     Mouth: Mucous membranes are moist.  Eyes:     Conjunctiva/sclera: Conjunctivae normal.     Pupils: Pupils are equal, round, and reactive to light.  Cardiovascular:     Rate and Rhythm: Normal rate and regular rhythm.     Heart sounds: No murmur heard. Pulmonary:     Effort: Pulmonary effort is normal. No respiratory distress.     Breath sounds: Normal breath sounds.  Abdominal:     Palpations: Abdomen is soft.     Tenderness: There is no abdominal tenderness.  Genitourinary:    Penis: Normal.      Comments: Patient is circumcised.  No discharge.  No lesions.  Patient a lot of tenderness to the left testicle which is riding high.  Some discomfort in the inguinal canal as well but no obvious hernia.  Right testicle nontender.  No scrotal swelling. Musculoskeletal:        General: No swelling.     Cervical back: Normal range of motion and neck supple.  Skin:    General: Skin is warm and dry.     Capillary  Refill: Capillary refill takes less than 2 seconds.  Neurological:     General: No focal deficit present.     Mental Status: He is alert and oriented to person, place, and time.     Cranial Nerves: No cranial nerve deficit.     Sensory: No sensory deficit.     Motor: No weakness.  Psychiatric:        Mood and Affect: Mood normal.     (all labs ordered are listed, but only abnormal results are displayed) Labs Reviewed  BASIC METABOLIC PANEL WITH GFR  CBC WITH DIFFERENTIAL/PLATELET  URINALYSIS, ROUTINE W REFLEX MICROSCOPIC    EKG: None  Radiology: No results found.   Procedures   Medications Ordered in the ED  sodium chloride  0.9 % bolus 1,000 mL (has no administration in time range)  ondansetron (ZOFRAN) injection 4 mg (has  no administration in time range)  HYDROmorphone (DILAUDID) injection 1 mg (has no administration in time range)  0.9 %  sodium chloride  infusion (has no administration in time range)                                    Medical Decision Making Amount and/or Complexity of Data Reviewed Labs: ordered. Radiology: ordered.  Risk Prescription drug management.   Patient very uncomfortable due to this left testicular pain.  Need to rule out torsion.  Will start IV give fluids antinausea medicine pain medicine and will get ultrasound to rule out testicular torsion.  If its negative may do CT scan just to rule out kidney stone.  Patient metabolic panel sodium 134 CO2 20 glucose 151 GFR 45.  Creatinine 1.74.  Patient is receiving some IV fluid.  CBC white count 20,000 hemoglobin 15.3 platelets 290.  Urinalysis is still pending.  Scrotal ultrasound basically normal appearance of the testicles.  No evidence of torsion no inflammatory changes.  Does have small bilateral hydroceles and and a left varicocele.  Based on this we will go ahead and do CT renal study.  Not sure why he has a white count of 20,000 and the testicular studies being essentially negative.  Urinalysis other than ketones is negative.  Fairly hemoconcentrated.  No bacteria not consistent with urinary tract infection.  CT renal cyst study.  9 mm calculi within the proximal and distal left ureter with extensive hydronephrosis.  That makes sense based on the presentation.  Will treat with pain medicine and have him follow-up with urology.  Final diagnoses:  Left testicular pain    ED Discharge Orders     None          Geraldene Hamilton, MD 02/03/24 CLAIR    Geraldene Hamilton, MD 02/03/24 2107    Geraldene Hamilton, MD 02/03/24 [phone]

## 2024-02-04 ENCOUNTER — Inpatient Hospital Stay (HOSPITAL_COMMUNITY)

## 2024-02-04 ENCOUNTER — Encounter (HOSPITAL_COMMUNITY): Payer: Self-pay | Admitting: Urology

## 2024-02-04 ENCOUNTER — Encounter (HOSPITAL_COMMUNITY)
Admission: EM | Disposition: A | Discharge status: Home / Self Care | Payer: Self-pay | Source: Ambulatory Visit | Attending: Urology

## 2024-02-04 ENCOUNTER — Ambulatory Visit (HOSPITAL_COMMUNITY)
Admission: EM | Admit: 2024-02-04 | Discharge: 2024-02-04 | Disposition: A | Discharge status: Home / Self Care | Source: Ambulatory Visit | Attending: Urology | Admitting: Urology

## 2024-02-04 ENCOUNTER — Other Ambulatory Visit: Payer: Self-pay | Admitting: Urology

## 2024-02-04 ENCOUNTER — Inpatient Hospital Stay (HOSPITAL_COMMUNITY): Admitting: Anesthesiology

## 2024-02-04 DIAGNOSIS — N132 Hydronephrosis with renal and ureteral calculous obstruction: Secondary | ICD-10-CM | POA: Diagnosis present

## 2024-02-04 DIAGNOSIS — N201 Calculus of ureter: Secondary | ICD-10-CM

## 2024-02-04 DIAGNOSIS — D72829 Elevated white blood cell count, unspecified: Secondary | ICD-10-CM | POA: Diagnosis not present

## 2024-02-04 SURGERY — CYSTOSCOPY, WITH RETROGRADE PYELOGRAM AND URETERAL STENT INSERTION
Anesthesia: General | Laterality: Left

## 2024-02-04 MED ORDER — OXYBUTYNIN CHLORIDE 5 MG PO TABS: 5.0000 mg | ORAL_TABLET | Freq: Three times a day (TID) | ORAL | 1 refills | Status: AC | PRN

## 2024-02-04 MED ORDER — ACETAMINOPHEN 500 MG PO TABS
1000.0000 mg | ORAL_TABLET | Freq: Once | ORAL | Status: AC
  Administered 2024-02-04: 1000 mg via ORAL
  Filled 2024-02-04: qty 2

## 2024-02-04 MED ORDER — DEXAMETHASONE SOD PHOSPHATE PF 10 MG/ML IJ SOLN
INTRAMUSCULAR | Status: DC | PRN
  Administered 2024-02-04: 10 mg via INTRAVENOUS

## 2024-02-04 MED ORDER — PROPOFOL 10 MG/ML IV BOLUS
INTRAVENOUS | Status: DC | PRN
  Administered 2024-02-04: 180 mg via INTRAVENOUS

## 2024-02-04 MED ORDER — SUCCINYLCHOLINE CHLORIDE 200 MG/10ML IV SOSY
PREFILLED_SYRINGE | INTRAVENOUS | Status: DC | PRN
  Administered 2024-02-04: 120 mg via INTRAVENOUS

## 2024-02-04 MED ORDER — OXYCODONE HCL 5 MG/5ML PO SOLN: 5.0000 mg | Freq: Once | ORAL | Status: DC | PRN

## 2024-02-04 MED ORDER — FENTANYL CITRATE (PF) 250 MCG/5ML IJ SOLN
INTRAMUSCULAR | Status: DC | PRN
  Administered 2024-02-04 (×2): 50 ug via INTRAVENOUS

## 2024-02-04 MED ORDER — MIDAZOLAM HCL 2 MG/2ML IJ SOLN
INTRAMUSCULAR | Status: AC
  Filled 2024-02-04: qty 2

## 2024-02-04 MED ORDER — LIDOCAINE 2% (20 MG/ML) 5 ML SYRINGE
INTRAMUSCULAR | Status: DC | PRN
  Administered 2024-02-04: 100 mg via INTRAVENOUS

## 2024-02-04 MED ORDER — MIDAZOLAM HCL 2 MG/2ML IJ SOLN
INTRAMUSCULAR | Status: DC | PRN
  Administered 2024-02-04: 2 mg via INTRAVENOUS

## 2024-02-04 MED ORDER — SODIUM CHLORIDE 0.9 % IR SOLN
Status: DC | PRN
  Administered 2024-02-04: 3000 mL

## 2024-02-04 MED ORDER — PHENAZOPYRIDINE HCL 200 MG PO TABS: 200.0000 mg | ORAL_TABLET | Freq: Three times a day (TID) | ORAL | 0 refills | Status: AC | PRN

## 2024-02-04 MED ORDER — OXYCODONE HCL 5 MG PO TABS: 5.0000 mg | ORAL_TABLET | Freq: Once | ORAL | Status: DC | PRN

## 2024-02-04 MED ORDER — FENTANYL CITRATE (PF) 100 MCG/2ML IJ SOLN
INTRAMUSCULAR | Status: AC
  Filled 2024-02-04: qty 2

## 2024-02-04 MED ORDER — CHLORHEXIDINE GLUCONATE 0.12 % MT SOLN
15.0000 mL | Freq: Once | OROMUCOSAL | Status: AC
  Administered 2024-02-04: 15 mL via OROMUCOSAL

## 2024-02-04 MED ORDER — CEPHALEXIN 500 MG PO CAPS: 500.0000 mg | ORAL_CAPSULE | Freq: Two times a day (BID) | ORAL | 0 refills | Status: AC

## 2024-02-04 MED ORDER — HYDROCODONE-ACETAMINOPHEN 5-325 MG PO TABS: 1.0000 | ORAL_TABLET | ORAL | 0 refills | Status: AC | PRN

## 2024-02-04 MED ORDER — ROCURONIUM BROMIDE 10 MG/ML (PF) SYRINGE
PREFILLED_SYRINGE | INTRAVENOUS | Status: DC | PRN
  Administered 2024-02-04: 5 mg via INTRAVENOUS

## 2024-02-04 MED ORDER — CEFAZOLIN SODIUM-DEXTROSE 2-4 GM/100ML-% IV SOLN
2.0000 g | INTRAVENOUS | Status: DC
  Filled 2024-02-04: qty 100

## 2024-02-04 MED ORDER — DROPERIDOL 2.5 MG/ML IJ SOLN: 0.6250 mg | Freq: Once | INTRAMUSCULAR | Status: DC | PRN

## 2024-02-04 MED ORDER — ONDANSETRON HCL 4 MG/2ML IJ SOLN
INTRAMUSCULAR | Status: DC | PRN
  Administered 2024-02-04: 4 mg via INTRAVENOUS

## 2024-02-04 MED ORDER — FENTANYL CITRATE (PF) 50 MCG/ML IJ SOSY: 25.0000 ug | PREFILLED_SYRINGE | INTRAMUSCULAR | Status: DC | PRN

## 2024-02-04 MED ORDER — LACTATED RINGERS IV SOLN: INTRAVENOUS | Status: DC

## 2024-02-04 MED ORDER — IOHEXOL 300 MG/ML  SOLN
INTRAMUSCULAR | Status: DC | PRN
  Administered 2024-02-04: 10 mL

## 2024-02-04 SURGICAL SUPPLY — 11 items
BAG URO CATCHER STRL LF (MISCELLANEOUS) ×1 IMPLANT
CATH URETL OPEN 5X70 (CATHETERS) ×1 IMPLANT
CLOTH BEACON ORANGE TIMEOUT ST (SAFETY) ×1 IMPLANT
GLOVE SURG LX STRL 8.0 MICRO (GLOVE) ×1 IMPLANT
GOWN STRL SURGICAL XL XLNG (GOWN DISPOSABLE) ×1 IMPLANT
GUIDEWIRE ZIPWRE .038 STRAIGHT (WIRE) ×1 IMPLANT
KIT TURNOVER KIT A (KITS) ×1 IMPLANT
MANIFOLD NEPTUNE II (INSTRUMENTS) ×1 IMPLANT
PACK CYSTO (CUSTOM PROCEDURE TRAY) ×1 IMPLANT
STENT URET 6FRX26 CONTOUR (STENTS) IMPLANT
TUBING CONNECTING 10 (TUBING) ×1 IMPLANT

## 2024-02-04 NOTE — Transfer of Care (Signed)
 Immediate Anesthesia Transfer of Care Note  Patient: Martin Mcclure  Procedure(s) Performed: CYSTOSCOPY, WITH RETROGRADE PYELOGRAM AND URETERAL STENT INSERTION (Left)  Patient Location: PACU  Anesthesia Type:General  Level of Consciousness: drowsy and patient cooperative  Airway & Oxygen Therapy: Patient Spontanous Breathing and Patient connected to nasal cannula oxygen  Post-op Assessment: Report given to RN and Post -op Vital signs reviewed and stable  Post vital signs: Reviewed and stable  Last Vitals:  Vitals Value Taken Time  BP 122/78 02/04/24 18:02  Temp 36.1 C 02/04/24 18:00  Pulse 64 02/04/24 18:06  Resp 0 02/04/24 18:05  SpO2 98 % 02/04/24 18:06  Vitals shown include unfiled device data.  Last Pain:  Vitals:   02/04/24 1601  TempSrc: Oral  PainSc: 0-No pain         Complications: No notable events documented.

## 2024-02-04 NOTE — Anesthesia Procedure Notes (Signed)
 Procedure Name: Intubation Date/Time: 02/04/2024 5:30 PM  Performed by: Cena Epps, CRNAPre-anesthesia Checklist: Patient identified, Emergency Drugs available, Suction available and Patient being monitored Patient Re-evaluated:Patient Re-evaluated prior to induction Oxygen Delivery Method: Circle System Utilized Preoxygenation: Pre-oxygenation with 100% oxygen Induction Type: IV induction Ventilation: Mask ventilation without difficulty Laryngoscope Size: Mac and 4 Grade View: Grade II Tube type: Oral Tube size: 7.5 mm Number of attempts: 1 Airway Equipment and Method: Stylet and Oral airway Placement Confirmation: ETT inserted through vocal cords under direct vision, positive ETCO2 and breath sounds checked- equal and bilateral Secured at: 23 cm Tube secured with: Tape Dental Injury: Teeth and Oropharynx as per pre-operative assessment

## 2024-02-04 NOTE — Op Note (Signed)
 Operative Note  Preoperative diagnosis:  1.  Obstructing 9 mm left distal and proximal ureteral stones  Postoperative diagnosis: 1.  Obstructing 9 mm left distal and proximal ureteral stones  Procedure(s): 1.  Cystoscopy with left ureteral stent placement 2.  Left retrograde pyelogram with intraoperative interpretation of fluoroscopic imaging  Surgeon: Lonni Han, MD  Assistants:  None  Anesthesia:  General  Complications:  None  EBL: Less than 5 mL  Specimens: 1.  None  Drains/Catheters: 1.  Left 6 French, 26 cm JJ stent without tether  Intraoperative findings:   No intravesical urethral abnormalities were seen. Left retrograde pyelogram revealed an obstructing left distal ureteral stone with uniform dilation of the remaining proximal aspects of the left ureter.  There was also a filling defect seen within the left UPJ, consistent with the obstructing stone seen on cross-sectional imaging.  There was uniform dilation of the left renal pelvis with no other filling defects. Significant resistance was met while placing his stent in the distal left ureter due to his obstructing stone  Indication:  Martin Mcclure is a 58 y.o. male with 2 obstructing 9 mm left proximal and distal ureteral stones with associated AKI, leukocytosis and pyuria.  He is here today for urgent stent placement.  He has been consented for the above procedures, voices understanding and wishes to proceed.  Description of procedure:  After informed consent was obtained, the patient was brought to the operating room and general LMA anesthesia was administered. The patient was then placed in the dorsolithotomy position and prepped and draped in the usual sterile fashion. A timeout was performed. A 21 French rigid cystoscope was then inserted into the urethral meatus and advanced into the bladder under direct vision. A complete bladder survey revealed no intravesical pathology.  A 5 French ureteral  catheter was then inserted into the left ureteral orifice and a retrograde pyelogram was obtained, with the findings listed above.  A Glidewire was then used to intubate the lumen of the ureteral catheter and was advanced up to the left renal pelvis, under fluoroscopic guidance.  The catheter was then removed, leaving the wire in place.  A 6 French, 26 cm JJ stent was then placed over the Glidewire and into good position within the left collecting system, confirming placement via fluoroscopy.  Of note, there was significant resistance within the distal left ureter while placing the stent.  The patient's bladder was then drained.  The rigid cystoscope was removed.  He tolerated procedure well and was transferred to the postanesthesia in stable condition.  Plan: Discharge home.  Plan for definitive stone treatment in 7 to 10 days.

## 2024-02-04 NOTE — H&P (Signed)
 Urology Preoperative H&P   Chief Complaint: Obstructing left ureteral stones  History of Present Illness: Martin Mcclure is a 58 y.o. male with two 9 mm proximal and distal left ureteral stones on CT from 02/03/2024 associated with moderate to severe left-sided hydronephrosis along with a leukocytosis, AKI and pyuria.  He is here today for left ureteral stent placement.  He denies interval episodes of nausea/vomiting, fever/chills, dysuria or hematuria.  No prior history of kidney stones.    Past Medical History:  Diagnosis Date   Allergy    Cancer (HCC)    Skin cancer on his nose   IBS (irritable bowel syndrome)     Past Surgical History:  Procedure Laterality Date   COLONOSCOPY     EXCISIONAL HEMORRHOIDECTOMY     Lypoma removed      Allergies: No Known Allergies  Family History  Problem Relation Age of Onset   Colon cancer Neg Hx    Esophageal cancer Neg Hx    Rectal cancer Neg Hx    Stomach cancer Neg Hx     Social History:  reports that he has never smoked. He quit smokeless tobacco use about 12 years ago. He reports current alcohol use. He reports that he does not use drugs.  ROS: A complete review of systems was performed.  All systems are negative except for pertinent findings as noted.  Physical Exam:  Vital signs in last 24 hours: Temp:  [98.1 F (36.7 C)-98.2 F (36.8 C)] 98.1 F (36.7 C) (10/09 1601) Pulse Rate:  [57-77] 57 (10/09 1601) Resp:  [18-28] 18 (10/09 1601) BP: (116-147)/(65-100) 116/70 (10/09 1601) SpO2:  [91 %-100 %] 93 % (10/09 1601) Weight:  [99.8 kg] 99.8 kg (10/09 1601) Constitutional:  Alert and oriented, No acute distress Cardiovascular: Regular rate and rhythm, No JVD Respiratory: Normal respiratory effort,  Neurologic: Grossly intact, no focal deficits Psychiatric: Normal mood and affect  Laboratory Data:  Recent Labs    02/03/24 1907  WBC 20.0*  HGB 15.3  HCT 42.3  PLT 290    Recent Labs    02/03/24 1907  NA 134*   K 3.8  CL 98  GLUCOSE 151*  BUN 20  CALCIUM 9.1  CREATININE 1.74*     Results for orders placed or performed during the hospital encounter of 02/03/24 (from the past 24 hours)  Urinalysis, Routine w reflex microscopic -Urine, Clean Catch     Status: Abnormal   Collection Time: 02/03/24  7:06 PM  Result Value Ref Range   Color, Urine YELLOW YELLOW   APPearance CLEAR CLEAR   Specific Gravity, Urine >=1.030 1.005 - 1.030   pH 6.0 5.0 - 8.0   Glucose, UA NEGATIVE NEGATIVE mg/dL   Hgb urine dipstick NEGATIVE NEGATIVE   Bilirubin Urine NEGATIVE NEGATIVE   Ketones, ur 80 (A) NEGATIVE mg/dL   Protein, ur 30 (A) NEGATIVE mg/dL   Nitrite NEGATIVE NEGATIVE   Leukocytes,Ua NEGATIVE NEGATIVE  Urinalysis, Microscopic (reflex)     Status: Abnormal   Collection Time: 02/03/24  7:06 PM  Result Value Ref Range   RBC / HPF NONE SEEN 0 - 5 RBC/hpf   WBC, UA NONE SEEN 0 - 5 WBC/hpf   Bacteria, UA RARE (A) NONE SEEN   Squamous Epithelial / HPF 0-5 0 - 5 /HPF  Basic metabolic panel with GFR     Status: Abnormal   Collection Time: 02/03/24  7:07 PM  Result Value Ref Range   Sodium 134 (L) 135 -  145 mmol/L   Potassium 3.8 3.5 - 5.1 mmol/L   Chloride 98 98 - 111 mmol/L   CO2 20 (L) 22 - 32 mmol/L   Glucose, Bld 151 (H) 70 - 99 mg/dL   BUN 20 6 - 20 mg/dL   Creatinine, Ser 8.25 (H) 0.61 - 1.24 mg/dL   Calcium 9.1 8.9 - 89.6 mg/dL   GFR, Estimated 45 (L) >60 mL/min   Anion gap 16 (H) 5 - 15  CBC with Differential/Platelet     Status: Abnormal   Collection Time: 02/03/24  7:07 PM  Result Value Ref Range   WBC 20.0 (H) 4.0 - 10.5 K/uL   RBC 4.95 4.22 - 5.81 MIL/uL   Hemoglobin 15.3 13.0 - 17.0 g/dL   HCT 57.6 60.9 - 47.9 %   MCV 85.5 80.0 - 100.0 fL   MCH 30.9 26.0 - 34.0 pg   MCHC 36.2 (H) 30.0 - 36.0 g/dL   RDW 87.7 88.4 - 84.4 %   Platelets 290 150 - 400 K/uL   nRBC 0.0 0.0 - 0.2 %   Neutrophils Relative % 83 %   Neutro Abs 16.7 (H) 1.7 - 7.7 K/uL   Lymphocytes Relative 8 %    Lymphs Abs 1.6 0.7 - 4.0 K/uL   Monocytes Relative 8 %   Monocytes Absolute 1.5 (H) 0.1 - 1.0 K/uL   Eosinophils Relative 0 %   Eosinophils Absolute 0.0 0.0 - 0.5 K/uL   Basophils Relative 0 %   Basophils Absolute 0.1 0.0 - 0.1 K/uL   Immature Granulocytes 1 %   Abs Immature Granulocytes 0.09 (H) 0.00 - 0.07 K/uL   No results found for this or any previous visit (from the past 240 hours).  Renal Function: Recent Labs    02/03/24 1907  CREATININE 1.74*   Estimated Creatinine Clearance: 55.5 mL/min (A) (by C-G formula based on SCr of 1.74 mg/dL (H)).  Radiologic Imaging: CT Renal Stone Study Result Date: 02/03/2024 CLINICAL DATA:  Left-sided flank pain for 2 days, initial encounter EXAM: CT ABDOMEN AND PELVIS WITHOUT CONTRAST TECHNIQUE: Multidetector CT imaging of the abdomen and pelvis was performed following the standard protocol without IV contrast. RADIATION DOSE REDUCTION: This exam was performed according to the departmental dose-optimization program which includes automated exposure control, adjustment of the mA and/or kV according to patient size and/or use of iterative reconstruction technique. COMPARISON:  Ultrasound of the scrotum from earlier in the same day. FINDINGS: Lower chest: No acute abnormality. Hepatobiliary: Fatty infiltration of the liver is noted. The gallbladder is within normal limits. Pancreas: Unremarkable. No pancreatic ductal dilatation or surrounding inflammatory changes. Spleen: Normal in size without focal abnormality. Adrenals/Urinary Tract: Adrenal glands are within normal limits. Right kidney demonstrates punctate nonobstructing stone in the lower pole. Left kidney demonstrates a small nonobstructing stone in the lower pole measuring 2 mm. Hydronephrosis is noted as well as a somewhat irregular 9 mm stone in the proximal left ureter. The more distal left ureter is also dilated and a 9 mm stone in the distal left ureter just above the UVJ is noted causing the  obstructive change. Bladder is decompressed. Stomach/Bowel: Colon is decompressed. Scattered minimal diverticular changes noted. The appendix is within normal limits. Small bowel and stomach are within normal limits. Vascular/Lymphatic: No significant vascular findings are present. No enlarged abdominal or pelvic lymph nodes. Reproductive: Prostate is unremarkable. Other: No abdominal wall hernia or abnormality. No abdominopelvic ascites. Musculoskeletal: No acute or significant osseous findings. IMPRESSION: 9 mm calculi  within the proximal and distal left ureter with extensive hydronephrosis. Small nonobstructing stones bilaterally. Diverticulosis without diverticulitis. Fatty liver. Electronically Signed   By: Oneil Devonshire M.D.   On: 02/03/2024 21:39   US  SCROTUM W/DOPPLER Result Date: 02/03/2024 CLINICAL DATA:  Acute onset of left testicular pain yesterday, worsening today. EXAM: SCROTAL ULTRASOUND DOPPLER ULTRASOUND OF THE TESTICLES TECHNIQUE: Complete ultrasound examination of the testicles, epididymis, and other scrotal structures was performed. Color and spectral Doppler ultrasound were also utilized to evaluate blood flow to the testicles. COMPARISON:  None Available. FINDINGS: Right testicle Measurements: 3.9 x 2.3 x 3.1 cm. No mass or microlithiasis visualized. Left testicle Measurements: 3.2 x 2 x 2.9 cm. No mass or microlithiasis visualized. Right epididymis: Small epididymal cyst or spermatocele measuring 6 mm diameter. Left epididymis: Small epididymal cyst or spermatocele measuring 8 mm diameter. Hydrocele:  Small bilateral hydroceles. Varicocele:  Small left varicocele. Pulsed Doppler interrogation of both testes demonstrates normal low resistance arterial and venous waveforms bilaterally. IMPRESSION: 1. Normal ultrasound appearance of the testicles. No evidence of testicular mass, torsion, or inflammatory change. 2. Small bilateral hydroceles. 3. Small left varicocele. Electronically Signed   By:  Elsie Gravely M.D.   On: 02/03/2024 20:19    I independently reviewed the above imaging studies.  Assessment and Plan ANGUS AMINI is a 58 y.o. male with 2 obstructing left ureteral stones  -The risks, benefits and alternatives of cystoscopy with LEFT JJ stent placement was discussed with the patient.  Risks include, but are not limited to: bleeding, urinary tract infection, ureteral injury, ureteral stricture disease, chronic pain, urinary symptoms, bladder injury, stent migration, the need for nephrostomy tube placement, MI, CVA, DVT, PE and the inherent risks with general anesthesia.  The patient voices understanding and wishes to proceed.    Lonni Han, MD 02/04/2024, 4:30 PM  [facility] Pager: [phone]

## 2024-02-04 NOTE — Anesthesia Preprocedure Evaluation (Addendum)
 Anesthesia Evaluation  Patient identified by MRN, date of birth, ID band Patient awake    Reviewed: Allergy & Precautions, NPO status , Patient's Chart, lab work & pertinent test results  History of Anesthesia Complications Negative for: history of anesthetic complications  Airway Mallampati: II  TM Distance: >3 FB Neck ROM: Full    Dental no notable dental hx.    Pulmonary neg pulmonary ROS   Pulmonary exam normal        Cardiovascular negative cardio ROS Normal cardiovascular exam     Neuro/Psych negative neurological ROS     GI/Hepatic negative GI ROS, Neg liver ROS,,,  Endo/Other  negative endocrine ROS    Renal/GU LEFT URETERAL STONES     Musculoskeletal negative musculoskeletal ROS (+)    Abdominal   Peds  Hematology negative hematology ROS (+)   Anesthesia Other Findings   Reproductive/Obstetrics                              Anesthesia Physical Anesthesia Plan  ASA: 2 and emergent  Anesthesia Plan: General   Post-op Pain Management: Tylenol PO (pre-op)*   Induction: Intravenous and Rapid sequence  PONV Risk Score and Plan: 2 and Treatment may vary due to age or medical condition, Ondansetron, Dexamethasone and Midazolam  Airway Management Planned: Oral ETT  Additional Equipment: None  Intra-op Plan:   Post-operative Plan: Extubation in OR  Informed Consent: I have reviewed the patients History and Physical, chart, labs and discussed the procedure including the risks, benefits and alternatives for the proposed anesthesia with the patient or authorized representative who has indicated his/her understanding and acceptance.     Dental advisory given  Plan Discussed with: CRNA  Anesthesia Plan Comments:          Anesthesia Quick Evaluation

## 2024-02-05 ENCOUNTER — Encounter (HOSPITAL_COMMUNITY): Payer: Self-pay | Admitting: Urology

## 2024-02-08 NOTE — Anesthesia Postprocedure Evaluation (Addendum)
 Anesthesia Post Note  Patient: Martin Mcclure  Procedure(s) Performed: CYSTOSCOPY, WITH RETROGRADE PYELOGRAM AND URETERAL STENT INSERTION (Left)     Patient location during evaluation: PACU Anesthesia Type: General Level of consciousness: awake and alert Pain management: pain level controlled Vital Signs Assessment: post-procedure vital signs reviewed and stable Respiratory status: spontaneous breathing, nonlabored ventilation and respiratory function stable Cardiovascular status: blood pressure returned to baseline Postop Assessment: no apparent nausea or vomiting Anesthetic complications: no   No notable events documented.  Last Vitals:  Vitals:   02/04/24 1830 02/04/24 1835  BP: 126/62 130/66  Pulse: 61 62  Resp: 15 20  Temp: (!) 36.1 C (!) 36.1 C  SpO2: 98% 99%    Last Pain:  Vitals:   02/04/24 1835  TempSrc: Temporal  PainSc: 0-No pain                 Vertell Row

## 2024-02-10 ENCOUNTER — Other Ambulatory Visit: Payer: Self-pay | Admitting: Urology

## 2024-02-11 NOTE — Progress Notes (Signed)
 Sent message, via epic in basket, requesting orders in epic from Careers adviser.

## 2024-02-16 NOTE — Progress Notes (Signed)
 Second request for Pre op orders left voicemail for [place].

## 2024-02-17 NOTE — Patient Instructions (Signed)
 SURGICAL WAITING ROOM VISITATION  Patients having surgery or a procedure may have no more than 2 support people in the waiting area - these visitors may rotate.    Children under the age of 52 must have an adult with them who is not the patient.  Visitors with respiratory illnesses are discouraged from visiting and should remain at home.  If the patient needs to stay at the hospital during part of their recovery, the visitor guidelines for inpatient rooms apply. Pre-op nurse will coordinate an appropriate time for 1 support person to accompany patient in pre-op.  This support person may not rotate.    Please refer to the [facility] website for the visitor guidelines for Inpatients (after your surgery is over and you are in a regular room).    Your procedure is scheduled on: 02/24/24   Report to [facility] Main Entrance    Report to admitting at 10:30 AM   Call this number if you have problems the morning of surgery [phone]   Do not eat food or drink liquids :After Midnight.          If you have questions, please contact your surgeon's office.   FOLLOW BOWEL PREP AND ANY ADDITIONAL PRE OP INSTRUCTIONS YOU RECEIVED FROM YOUR SURGEON'S OFFICE!!!     Oral Hygiene is also important to reduce your risk of infection.                                    Remember - BRUSH YOUR TEETH THE MORNING OF SURGERY WITH YOUR REGULAR TOOTHPASTE  DENTURES WILL BE REMOVED PRIOR TO SURGERY PLEASE DO NOT APPLY Poly grip OR ADHESIVES!!!   Stop all vitamins and herbal supplements 7 days before surgery.   Take these medicines the morning of surgery with A SIP OF WATER: None                               You may not have any metal on your body including jewelry, and body piercing             Do not wear lotions, powders, cologne, or deodorant              Men may shave face and neck.   Do not bring valuables to the hospital. [facility] IS NOT             RESPONSIBLE   FOR  VALUABLES.   Contacts, glasses, dentures or bridgework may not be worn into surgery.  DO NOT BRING YOUR HOME MEDICATIONS TO THE HOSPITAL. PHARMACY WILL DISPENSE MEDICATIONS LISTED ON YOUR MEDICATION LIST TO YOU DURING YOUR ADMISSION IN THE HOSPITAL!    Patients discharged on the day of surgery will not be allowed to drive home.  Someone NEEDS to stay with you for the first 24 hours after anesthesia.              Please read over the following fact sheets you were given: IF YOU HAVE QUESTIONS ABOUT YOUR PRE-OP INSTRUCTIONS PLEASE CALL [phone] [place].   If you received a COVID test during your pre-op visit  it is requested that you wear a mask when out in public, stay away from anyone that may not be feeling well and notify your surgeon if you develop symptoms. If you test positive for Covid or have been in  contact with anyone that has tested positive in the last 10 days please notify you surgeon.    [facility] - Preparing for Surgery Before surgery, you can play an important role.  Because skin is not sterile, your skin needs to be as free of germs as possible.  You can reduce the number of germs on your skin by washing with CHG (chlorahexidine gluconate) soap before surgery.  CHG is an antiseptic cleaner which kills germs and bonds with the skin to continue killing germs even after washing. Please DO NOT use if you have an allergy to CHG or antibacterial soaps.  If your skin becomes reddened/irritated stop using the CHG and inform your nurse when you arrive at Short Stay. Do not shave (including legs and underarms) for at least 48 hours prior to the first CHG shower.  You may shave your face/neck.  Please follow these instructions carefully:  1.  Shower with CHG Soap the night before surgery and the morning of surgery.  2.  If you choose to wash your hair, wash your hair first as usual with your normal  shampoo.  3.  After you shampoo, rinse your hair and body thoroughly to remove the  shampoo.                             4.  Use CHG as you would any other liquid soap.  You can apply chg directly to the skin and wash.  Gently with a scrungie or clean washcloth.  5.  Apply the CHG Soap to your body ONLY FROM THE NECK DOWN.   Do   not use on face/ open                           Wound or open sores. Avoid contact with eyes, ears mouth and   genitals (private parts).                       Wash face,  Genitals (private parts) with your normal soap.             6.  Wash thoroughly, paying special attention to the area where your    surgery  will be performed.  7.  Thoroughly rinse your body with warm water from the neck down.  8.  DO NOT shower/wash with your normal soap after using and rinsing off the CHG Soap.                9.  Pat yourself dry with a clean towel.            10.  Wear clean pajamas.            11.  Place clean sheets on your bed the night of your first shower and do not  sleep with pets. Day of Surgery : Do not apply any CHG, lotions/deodorants the morning of surgery.  Please wear clean clothes to the hospital/surgery center.  FAILURE TO FOLLOW THESE INSTRUCTIONS MAY RESULT IN THE CANCELLATION OF YOUR SURGERY  PATIENT SIGNATURE_________________________________  NURSE SIGNATURE__________________________________  ________________________________________________________________________

## 2024-02-17 NOTE — Progress Notes (Signed)
 COVID Vaccine Completed:  Date of COVID positive in last 90 days:  PCP - Sydelle Close, PA Cardiologist - n/a  Chest x-ray - N/A EKG - N/A Stress Test - N/A ECHO - N/A Cardiac Cath - n/a Pacemaker/ICD device last checked:N/A Spinal Cord Stimulator:N/A  Bowel Prep - N/A  Sleep Study - N/A CPAP -   Fasting Blood Sugar - N/A Checks Blood Sugar _____ times a day  Last dose of GLP1 agonist-  N/A GLP1 instructions:  Do not take after     Last dose of SGLT-2 inhibitors-  N/A SGLT-2 instructions:  Do not take after     Blood Thinner Instructions: N/A Last dose:   Time: Aspirin Instructions:N/A Last Dose:  Activity level: Can go up a flight of stairs and perform activities of daily living without stopping and without symptoms of chest pain or shortness of breath.  Anesthesia review: N/A  Patient denies shortness of breath, fever, cough and chest pain at PAT appointment  Patient verbalized understanding of instructions that were given to them at the PAT appointment. Patient was also instructed that they will need to review over the PAT instructions again at home before surgery.

## 2024-02-18 ENCOUNTER — Encounter (HOSPITAL_COMMUNITY): Payer: Self-pay

## 2024-02-18 ENCOUNTER — Encounter (HOSPITAL_COMMUNITY)
Admission: RE | Admit: 2024-02-18 | Discharge: 2024-02-18 | Disposition: A | Discharge status: Home / Self Care | Source: Ambulatory Visit | Attending: Urology | Admitting: Urology

## 2024-02-18 ENCOUNTER — Other Ambulatory Visit: Payer: Self-pay

## 2024-02-18 VITALS — BP 132/78 | HR 65 | Temp 98.4°F | Resp 16 | Ht 70.0 in | Wt 222.0 lb

## 2024-02-18 DIAGNOSIS — Z01818 Encounter for other preprocedural examination: Secondary | ICD-10-CM

## 2024-02-18 DIAGNOSIS — Z01812 Encounter for preprocedural laboratory examination: Secondary | ICD-10-CM | POA: Diagnosis present

## 2024-02-18 HISTORY — DX: Gastro-esophageal reflux disease without esophagitis: K21.9

## 2024-02-18 HISTORY — DX: Personal history of urinary calculi: Z87.442

## 2024-02-18 LAB — CBC
HCT: 45.2 % (ref 39.0–52.0)
Hemoglobin: 15 g/dL (ref 13.0–17.0)
MCH: 29.8 pg (ref 26.0–34.0)
MCHC: 33.2 g/dL (ref 30.0–36.0)
MCV: 89.7 fL (ref 80.0–100.0)
Platelets: 350 K/uL (ref 150–400)
RBC: 5.04 MIL/uL (ref 4.22–5.81)
RDW: 12.2 % (ref 11.5–15.5)
WBC: 10.7 K/uL — ABNORMAL HIGH (ref 4.0–10.5)
nRBC: 0 % (ref 0.0–0.2)

## 2024-02-24 ENCOUNTER — Ambulatory Visit (HOSPITAL_COMMUNITY)
Admission: RE | Admit: 2024-02-24 | Discharge: 2024-02-24 | Disposition: A | Discharge status: Home / Self Care | Source: Ambulatory Visit | Attending: Urology | Admitting: Urology

## 2024-02-24 ENCOUNTER — Other Ambulatory Visit: Payer: Self-pay

## 2024-02-24 ENCOUNTER — Ambulatory Visit (HOSPITAL_COMMUNITY): Admitting: Anesthesiology

## 2024-02-24 ENCOUNTER — Ambulatory Visit (HOSPITAL_COMMUNITY)

## 2024-02-24 ENCOUNTER — Encounter (HOSPITAL_COMMUNITY): Payer: Self-pay | Admitting: Urology

## 2024-02-24 ENCOUNTER — Encounter (HOSPITAL_COMMUNITY)
Admission: RE | Disposition: A | Discharge status: Home / Self Care | Payer: Self-pay | Source: Ambulatory Visit | Attending: Urology

## 2024-02-24 DIAGNOSIS — N201 Calculus of ureter: Secondary | ICD-10-CM

## 2024-02-24 DIAGNOSIS — Z87442 Personal history of urinary calculi: Secondary | ICD-10-CM | POA: Diagnosis not present

## 2024-02-24 DIAGNOSIS — K219 Gastro-esophageal reflux disease without esophagitis: Secondary | ICD-10-CM | POA: Insufficient documentation

## 2024-02-24 SURGERY — CYSTOSCOPY/URETEROSCOPY/HOLMIUM LASER/STENT PLACEMENT
Anesthesia: General | Site: Ureter | Laterality: Left

## 2024-02-24 MED ORDER — ORAL CARE MOUTH RINSE
15.0000 mL | Freq: Once | OROMUCOSAL | Status: AC
  Administered 2024-02-24: 15 mL via OROMUCOSAL

## 2024-02-24 MED ORDER — ONDANSETRON HCL 4 MG/2ML IJ SOLN
INTRAMUSCULAR | Status: DC | PRN
  Administered 2024-02-24: 4 mg via INTRAVENOUS

## 2024-02-24 MED ORDER — ACETAMINOPHEN 10 MG/ML IV SOLN
INTRAVENOUS | Status: DC | PRN
  Administered 2024-02-24: 1000 mg via INTRAVENOUS

## 2024-02-24 MED ORDER — AMISULPRIDE (ANTIEMETIC) 5 MG/2ML IV SOLN
10.0000 mg | Freq: Once | INTRAVENOUS | Status: DC | PRN
Start: 2024-02-24 — End: 2024-02-24

## 2024-02-24 MED ORDER — OXYCODONE HCL 5 MG PO TABS
ORAL_TABLET | ORAL | Status: AC
  Filled 2024-02-24: qty 1

## 2024-02-24 MED ORDER — ACETAMINOPHEN 500 MG PO TABS: 1000.0000 mg | ORAL_TABLET | Freq: Once | ORAL | Status: DC

## 2024-02-24 MED ORDER — SODIUM CHLORIDE 0.9 % IR SOLN
Status: DC | PRN
  Administered 2024-02-24: 3000 mL

## 2024-02-24 MED ORDER — FENTANYL CITRATE (PF) 50 MCG/ML IJ SOSY: 25.0000 ug | PREFILLED_SYRINGE | INTRAMUSCULAR | Status: DC | PRN

## 2024-02-24 MED ORDER — MIDAZOLAM HCL 5 MG/5ML IJ SOLN
INTRAMUSCULAR | Status: DC | PRN
  Administered 2024-02-24: 2 mg via INTRAVENOUS

## 2024-02-24 MED ORDER — MIDAZOLAM HCL 2 MG/2ML IJ SOLN
INTRAMUSCULAR | Status: AC
  Filled 2024-02-24: qty 2

## 2024-02-24 MED ORDER — OXYCODONE HCL 5 MG/5ML PO SOLN: 5.0000 mg | Freq: Once | ORAL | Status: AC | PRN

## 2024-02-24 MED ORDER — ACETAMINOPHEN 10 MG/ML IV SOLN
INTRAVENOUS | Status: AC
Start: 2024-02-24 — End: 2024-02-24
  Filled 2024-02-24: qty 100

## 2024-02-24 MED ORDER — PROPOFOL 10 MG/ML IV BOLUS
INTRAVENOUS | Status: DC | PRN
  Administered 2024-02-24: 200 mg via INTRAVENOUS

## 2024-02-24 MED ORDER — LACTATED RINGERS IV SOLN: INTRAVENOUS | Status: DC

## 2024-02-24 MED ORDER — CEFAZOLIN SODIUM-DEXTROSE 2-4 GM/100ML-% IV SOLN
2.0000 g | INTRAVENOUS | Status: AC
  Administered 2024-02-24: 2 g via INTRAVENOUS
  Filled 2024-02-24: qty 100

## 2024-02-24 MED ORDER — CHLORHEXIDINE GLUCONATE 0.12 % MT SOLN: 15.0000 mL | Freq: Once | OROMUCOSAL | Status: AC

## 2024-02-24 MED ORDER — DEXAMETHASONE SODIUM PHOSPHATE 4 MG/ML IJ SOLN
INTRAMUSCULAR | Status: DC | PRN
  Administered 2024-02-24: 5 mg via INTRAVENOUS

## 2024-02-24 MED ORDER — FENTANYL CITRATE (PF) 100 MCG/2ML IJ SOLN
INTRAMUSCULAR | Status: AC
  Filled 2024-02-24: qty 2

## 2024-02-24 MED ORDER — PROPOFOL 10 MG/ML IV BOLUS
INTRAVENOUS | Status: AC
  Filled 2024-02-24: qty 20

## 2024-02-24 MED ORDER — FENTANYL CITRATE (PF) 100 MCG/2ML IJ SOLN
INTRAMUSCULAR | Status: DC | PRN
  Administered 2024-02-24 (×2): 25 ug via INTRAVENOUS
  Administered 2024-02-24: 50 ug via INTRAVENOUS

## 2024-02-24 MED ORDER — OXYCODONE HCL 5 MG PO TABS
5.0000 mg | ORAL_TABLET | Freq: Once | ORAL | Status: AC | PRN
  Administered 2024-02-24: 5 mg via ORAL

## 2024-02-24 MED ORDER — LACTATED RINGERS IV SOLN: INTRAVENOUS | Status: DC | PRN

## 2024-02-24 MED ORDER — 0.9 % SODIUM CHLORIDE (POUR BTL) OPTIME
TOPICAL | Status: DC | PRN
  Administered 2024-02-24: 1000 mL

## 2024-02-24 SURGICAL SUPPLY — 18 items
BAG URO CATCHER STRL LF (MISCELLANEOUS) ×1 IMPLANT
BASKET ZERO TIP NITINOL 2.4FR (BASKET) IMPLANT
CATH URETL OPEN 5X70 (CATHETERS) ×1 IMPLANT
CLOTH BEACON ORANGE TIMEOUT ST (SAFETY) ×1 IMPLANT
EXTRACTOR STONE NITINOL NGAGE (UROLOGICAL SUPPLIES) IMPLANT
FIBER LASER MOSES 200 DFL (Laser) IMPLANT
GLOVE SURG LX STRL 8.0 MICRO (GLOVE) ×1 IMPLANT
GOWN STRL SURGICAL XL XLNG (GOWN DISPOSABLE) ×1 IMPLANT
GUIDEWIRE STR DUAL SENSOR (WIRE) IMPLANT
GUIDEWIRE ZIPWRE .038 STRAIGHT (WIRE) ×1 IMPLANT
KIT TURNOVER KIT A (KITS) ×1 IMPLANT
MANIFOLD NEPTUNE II (INSTRUMENTS) ×1 IMPLANT
PACK CYSTO (CUSTOM PROCEDURE TRAY) ×1 IMPLANT
SHEATH NAVIGATOR HD 11/13X28 (SHEATH) IMPLANT
SHEATH NAVIGATOR HD 11/13X36 (SHEATH) IMPLANT
STENT URET 6FRX26 CONTOUR (STENTS) IMPLANT
TUBING CONNECTING 10 (TUBING) ×1 IMPLANT
TUBING UROLOGY SET (TUBING) ×1 IMPLANT

## 2024-02-24 NOTE — Op Note (Signed)
 Operative Note  Preoperative diagnosis:  1.  9 mm proximal and distal left ureteral stones  Postoperative diagnosis: 1.  9 mm proximal and distal left ureteral stones  Procedure(s): 1.  Cystoscopy with left ureteroscopy, holmium laser lithotripsy and left JJ stent exchange  Surgeon: Lonni Han, MD  Assistants:  None  Anesthesia:  General  Complications:  None  EBL: Less than 5 mL  Specimens: 1.  Previously placed left ureteral stent was removed intact, inspected and discarded 2.  Left ureteral stone fragments  Drains/Catheters: 1.  Left 6 French, 26 cm JJ stent without tether  Intraoperative findings:   Obstructing 9 mm left distal ureteral stone His left UPJ stone migrated into a midpole calyx following wire No intravesical or urethral abnormalities were seen  Indication:  Martin Mcclure is a 58 y.o. male with obstructing 9 mm left distal and proximal ureteral stones, requiring urgent ureteral stent placement on 02/04/2024.  The patient is here today for definitive stone treatment.  He has been consented for the above procedures, voices understanding and wishes to proceed.  Description of procedure:  After informed consent was obtained, the patient was brought to the operating room and general LMA anesthesia was administered. The patient was then placed in the dorsolithotomy position and prepped and draped in the usual sterile fashion. A timeout was performed. A 21 French rigid cystoscope was then inserted into the urethral meatus and advanced into the bladder under direct vision. A complete bladder survey revealed no intravesical pathology.  His previously placed left ureteral stent grasped its distal curl and retracted to the urethral meatus.  A Glidewire was then used to intubate the lumen of the stent was advanced up to the left renal pelvis, fluoroscopic guidance.  His previously placed stent was then removed intact, inspected and discarded.  A semirigid  ureteroscope was then advanced up the left ureter where his obstructing stone was identified.  A 200 m holmium laser was then used to fraction stone into numerous smaller pieces.  A ZeroTip basket was then used to extract all stone fragments from the lumen of the left distal ureter.  The semirigid ureteroscope was then removed, leaving the wire in place.  A flexible ureteroscope was advanced up the left ureter to the left renal pelvis where his UPJ stone was found to have migrated into a midpole calyx.  A 200 m homing laser was then used to dust the remaining stone into 1 mm or less fragments.  The flexible ureteroscope was then removed under direct vision, identifying no evidence of ureteral trauma or luminal stone burden.  The rigid cystoscope was then reinserted into the bladder over the wire.  A 6 French, 26 cm JJ stent was then advanced over the Glidewire and into good position within the left collecting system, confirming placement via fluoroscopy.  Patient's bladder was drained.  He tolerated the procedure well and was transferred to the postanesthesia in stable condition.  Plan: Follow-up in 1 week for office cystoscopy and stent removal

## 2024-02-24 NOTE — Anesthesia Procedure Notes (Signed)
 Procedure Name: LMA Insertion Date/Time: 02/24/2024 1:23 PM  Performed by: Menucha Dicesare, CRNAPre-anesthesia Checklist: Patient identified, Emergency Drugs available, Suction available and Patient being monitored Patient Re-evaluated:Patient Re-evaluated prior to induction Oxygen Delivery Method: Circle system utilized Preoxygenation: Pre-oxygenation with 100% oxygen Induction Type: IV induction Ventilation: Mask ventilation without difficulty LMA: LMA inserted and LMA with gastric port inserted LMA Size: 4.0 Tube type: Oral Number of attempts: 1 Airway Equipment and Method: Stylet and Oral airway Placement Confirmation: positive ETCO2 and breath sounds checked- equal and bilateral Tube secured with: Tape Dental Injury: Teeth and Oropharynx as per pre-operative assessment

## 2024-02-24 NOTE — Transfer of Care (Signed)
 Immediate Anesthesia Transfer of Care Note  Patient: [name]  Procedure(s) Performed: CYSTOSCOPY/URETEROSCOPY/HOLMIUM LASER/STENT PLACEMENT (Left: Ureter)  Patient Location: PACU  Anesthesia Type:General  Level of Consciousness: awake and alert   Airway & Oxygen Therapy: Patient Spontanous Breathing and Patient connected to face mask oxygen  Post-op Assessment: Report given to RN and Post -op Vital signs reviewed and stable  Post vital signs: Reviewed and stable  Last Vitals:  Vitals Value Taken Time  BP    Temp    Pulse 72 02/24/24 14:30  Resp 17 02/24/24 14:30  SpO2 100 % 02/24/24 14:30  Vitals shown include unfiled device data.  Last Pain:  Vitals:   02/24/24 1103  TempSrc:   PainSc: 2       Patients Stated Pain Goal: 0 (02/24/24 1103)  Complications: No notable events documented.

## 2024-02-24 NOTE — H&P (Signed)
 Urology Preoperative H&P   Chief Complaint: Left ureteral stones   History of Present Illness: Martin Mcclure is a 58 y.o. male with obstructing 9 mm left distal and proximal ureteral stones, s/p urgent left ureteral stent placement on 02/04/24 due to a suspected UTI and AKI.  He is here today for definitive stone treatment.  He report left sided stent related discomfort/urinary urgency, but denies interval UTIs, dysuria or hematuria.      Past Medical History:  Diagnosis Date   Allergy    Cancer (HCC)    Skin cancer on his nose   GERD (gastroesophageal reflux disease)    History of kidney stones    IBS (irritable bowel syndrome)     Past Surgical History:  Procedure Laterality Date   COLONOSCOPY     CYSTOSCOPY W/ URETERAL STENT PLACEMENT Left 02/04/2024   Procedure: CYSTOSCOPY, WITH RETROGRADE PYELOGRAM AND URETERAL STENT INSERTION;  Surgeon: Devere Lonni Righter, MD;  Location: [facility];  Service: Urology;  Laterality: Left;   ELBOW ARTHROSCOPY Right    EXCISIONAL HEMORRHOIDECTOMY     Lypoma removed     WISDOM TOOTH EXTRACTION      Allergies: No Known Allergies  Family History  Problem Relation Age of Onset   Colon cancer Neg Hx    Esophageal cancer Neg Hx    Rectal cancer Neg Hx    Stomach cancer Neg Hx     Social History:  reports that he has never smoked. He quit smokeless tobacco use about 12 years ago. He reports current alcohol use of about 1.0 standard drink of alcohol per week. He reports that he does not use drugs.  ROS: A complete review of systems was performed.  All systems are negative except for pertinent findings as noted.  Physical Exam:  Vital signs in last 24 hours:   Constitutional:  Alert and oriented, No acute distress Cardiovascular: Regular rate and rhythm, No JVD Respiratory: Normal respiratory effort, Lungs clear bilaterally GI: Abdomen is soft, nontender, nondistended, no abdominal masses GU: No CVA tenderness Lymphatic: No  lymphadenopathy Neurologic: Grossly intact, no focal deficits Psychiatric: Normal mood and affect  Laboratory Data:  No results for input(s): WBC, HGB, HCT, PLT in the last 72 hours.  No results for input(s): NA, K, CL, GLUCOSE, BUN, CALCIUM, CREATININE in the last 72 hours.  Invalid input(s): CO3   No results found for this or any previous visit (from the past 24 hours). No results found for this or any previous visit (from the past 240 hours).  Renal Function: No results for input(s): CREATININE in the last 168 hours. Estimated Creatinine Clearance: 55.7 mL/min (A) (by C-G formula based on SCr of 1.74 mg/dL (H)).  Radiologic Imaging: No results found.  I independently reviewed the above imaging studies.  Assessment and Plan Martin Mcclure is a 58 y.o. male with obstructing left distal and proximal ureteral stones   The risks, benefits and alternatives of cystoscopy with LEFT ureteroscopy, laser lithotripsy and ureteral stent placement was discussed the patient.  Risks included, but are not limited to: bleeding, urinary tract infection, ureteral injury/avulsion, ureteral stricture formation, retained stone fragments, the possibility that multiple surgeries may be required to treat the stone(s), MI, stroke, PE and the inherent risks of general anesthesia.  The patient voices understanding and wishes to proceed.      Lonni Devere, MD 02/24/2024, 8:13 AM  [facility] Pager: [phone]

## 2024-02-24 NOTE — Anesthesia Preprocedure Evaluation (Signed)
 Anesthesia Evaluation  Patient identified by MRN, date of birth, ID band Patient awake    Reviewed: Allergy & Precautions, NPO status , Patient's Chart, lab work & pertinent test results  History of Anesthesia Complications Negative for: history of anesthetic complications  Airway Mallampati: II  TM Distance: >3 FB Neck ROM: Full    Dental no notable dental hx.    Pulmonary neg pulmonary ROS   Pulmonary exam normal        Cardiovascular negative cardio ROS Normal cardiovascular exam     Neuro/Psych negative neurological ROS     GI/Hepatic Neg liver ROS,GERD  ,,  Endo/Other  negative endocrine ROS    Renal/GU LEFT URETERAL STONES     Musculoskeletal   Abdominal   Peds  Hematology negative hematology ROS (+)   Anesthesia Other Findings   Reproductive/Obstetrics                              Anesthesia Physical Anesthesia Plan  ASA: 2  Anesthesia Plan: General   Post-op Pain Management: Tylenol PO (pre-op)*   Induction: Intravenous  PONV Risk Score and Plan: 2 and Treatment may vary due to age or medical condition, Ondansetron, Dexamethasone and Midazolam  Airway Management Planned: LMA  Additional Equipment: None  Intra-op Plan:   Post-operative Plan: Extubation in OR  Informed Consent: I have reviewed the patients History and Physical, chart, labs and discussed the procedure including the risks, benefits and alternatives for the proposed anesthesia with the patient or authorized representative who has indicated his/her understanding and acceptance.     Dental advisory given  Plan Discussed with: CRNA  Anesthesia Plan Comments:          Anesthesia Quick Evaluation

## 2024-02-25 ENCOUNTER — Encounter (HOSPITAL_COMMUNITY): Payer: Self-pay | Admitting: Urology

## 2024-02-26 NOTE — Anesthesia Postprocedure Evaluation (Signed)
 Anesthesia Post Note  Patient: Martin Mcclure  Procedure(s) Performed: CYSTOSCOPY/URETEROSCOPY/HOLMIUM LASER/STENT PLACEMENT (Left: Ureter)     Patient location during evaluation: PACU Anesthesia Type: General Level of consciousness: awake and alert Pain management: pain level controlled Vital Signs Assessment: post-procedure vital signs reviewed and stable Respiratory status: spontaneous breathing, nonlabored ventilation, respiratory function stable and patient connected to nasal cannula oxygen Cardiovascular status: blood pressure returned to baseline and stable Postop Assessment: no apparent nausea or vomiting Anesthetic complications: no   No notable events documented.  Last Vitals:  Vitals:   02/24/24 1500 02/24/24 1515  BP: 111/61 123/71  Pulse: 76 72  Resp: 13   Temp: 37.2 C 36.9 C  SpO2: 100% 95%    Last Pain:  Vitals:   02/24/24 1536  TempSrc:   PainSc: 3                  Epifanio Lamar BRAVO
